# Patient Record
Sex: Male | Born: 1935 | Race: White | Hispanic: No | Marital: Married
Health system: Southern US, Community
[De-identification: ages and names within clinical notes are randomized; demographics above are authoritative.]

---

## 2005-01-11 ENCOUNTER — Other Ambulatory Visit: Payer: Self-pay

## 2005-01-11 ENCOUNTER — Inpatient Hospital Stay: Payer: Self-pay | Admitting: Specialist

## 2005-01-16 ENCOUNTER — Emergency Department: Payer: Self-pay | Admitting: Unknown Physician Specialty

## 2005-05-26 ENCOUNTER — Ambulatory Visit: Payer: Self-pay | Admitting: Unknown Physician Specialty

## 2005-09-08 ENCOUNTER — Other Ambulatory Visit: Payer: Self-pay

## 2005-09-08 ENCOUNTER — Ambulatory Visit: Payer: Self-pay | Admitting: Urology

## 2005-09-15 ENCOUNTER — Ambulatory Visit: Payer: Self-pay | Admitting: Urology

## 2006-10-17 ENCOUNTER — Ambulatory Visit: Payer: Self-pay | Admitting: Ophthalmology

## 2006-10-24 ENCOUNTER — Ambulatory Visit: Payer: Self-pay | Admitting: Ophthalmology

## 2006-11-24 ENCOUNTER — Ambulatory Visit: Payer: Self-pay | Admitting: Ophthalmology

## 2006-11-30 ENCOUNTER — Ambulatory Visit: Payer: Self-pay | Admitting: Ophthalmology

## 2007-06-16 ENCOUNTER — Ambulatory Visit: Payer: Self-pay | Admitting: General Practice

## 2007-09-07 ENCOUNTER — Ambulatory Visit: Payer: Self-pay | Admitting: General Practice

## 2007-09-20 ENCOUNTER — Ambulatory Visit: Payer: Self-pay | Admitting: General Practice

## 2007-11-14 ENCOUNTER — Inpatient Hospital Stay: Payer: Self-pay | Admitting: Internal Medicine

## 2007-11-14 ENCOUNTER — Other Ambulatory Visit: Payer: Self-pay

## 2008-04-03 ENCOUNTER — Ambulatory Visit: Payer: Self-pay | Admitting: Specialist

## 2008-05-21 ENCOUNTER — Ambulatory Visit: Payer: Self-pay | Admitting: Gastroenterology

## 2008-05-28 ENCOUNTER — Ambulatory Visit: Payer: Self-pay | Admitting: Gastroenterology

## 2008-06-06 ENCOUNTER — Ambulatory Visit: Payer: Self-pay | Admitting: Gastroenterology

## 2008-07-27 ENCOUNTER — Other Ambulatory Visit: Payer: Self-pay

## 2008-07-27 ENCOUNTER — Emergency Department: Payer: Self-pay | Admitting: Emergency Medicine

## 2008-07-28 ENCOUNTER — Emergency Department: Payer: Self-pay | Admitting: Emergency Medicine

## 2008-10-11 ENCOUNTER — Encounter: Payer: Self-pay | Admitting: Specialist

## 2009-05-05 ENCOUNTER — Ambulatory Visit: Payer: Self-pay | Admitting: Internal Medicine

## 2009-06-13 ENCOUNTER — Ambulatory Visit: Payer: Self-pay | Admitting: General Practice

## 2009-09-17 IMAGING — CT CT ABD-PELV W/ CM
1 of 2 series · 15 of 32 positions shown, 19 images · non-contrast
Comparison: none

REASON FOR EXAM: Abdominal pain, weight loss
COMMENTS:

[Series 2: abdomen · axial · 0.74mm/px · z∈[-133,+323]mm · 15 of 63 slices shown, 19 images]
[im 3/63  soft-tissue]
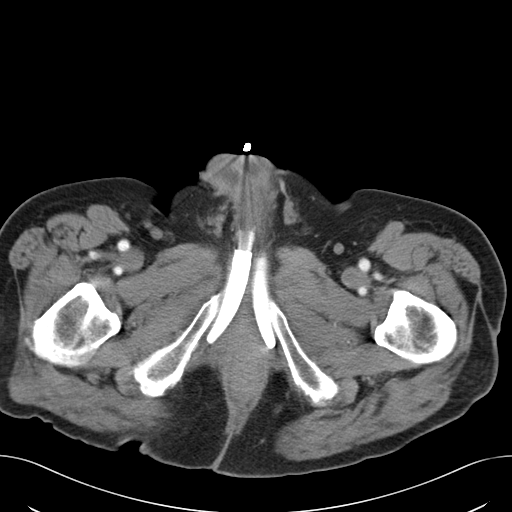
[im 3/63  bone]
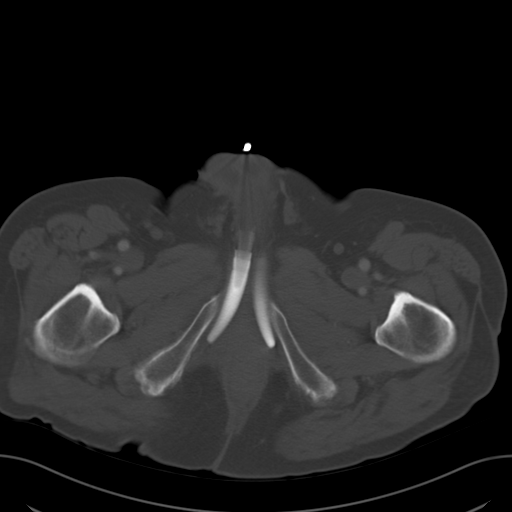
[im 8/63  soft-tissue]
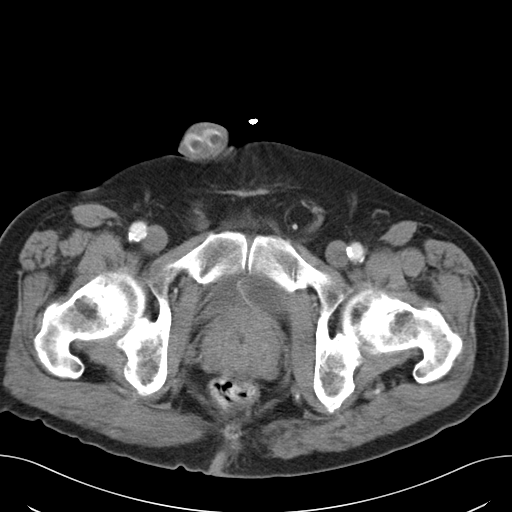
[im 13/63  soft-tissue]
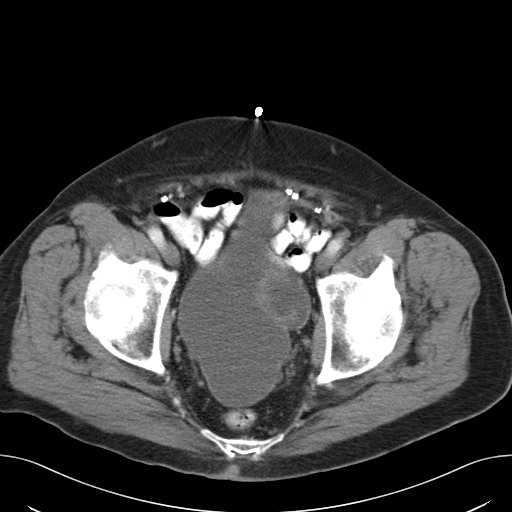
[im 19/63  soft-tissue]
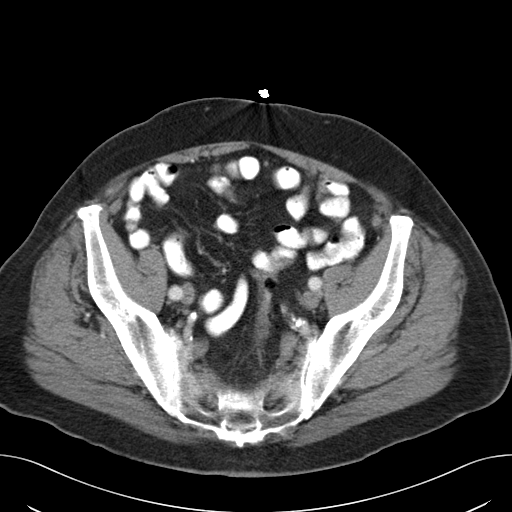
[im 21/63  soft-tissue]
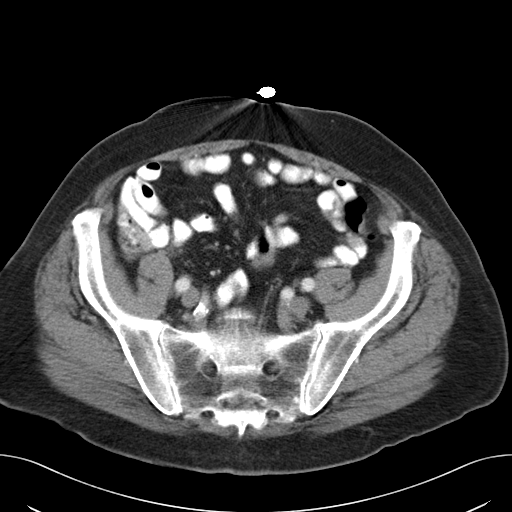
[im 26/63  soft-tissue]
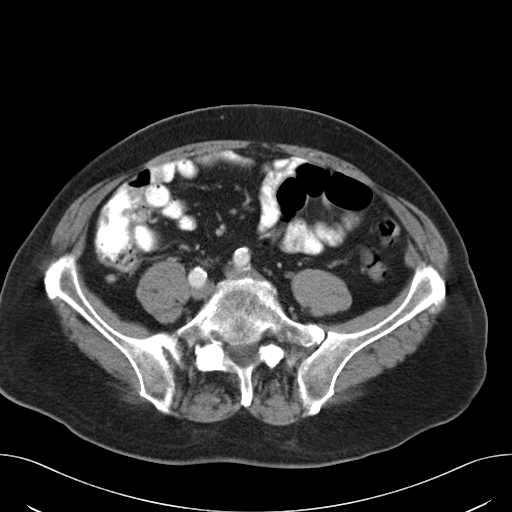
[im 32/63  soft-tissue]
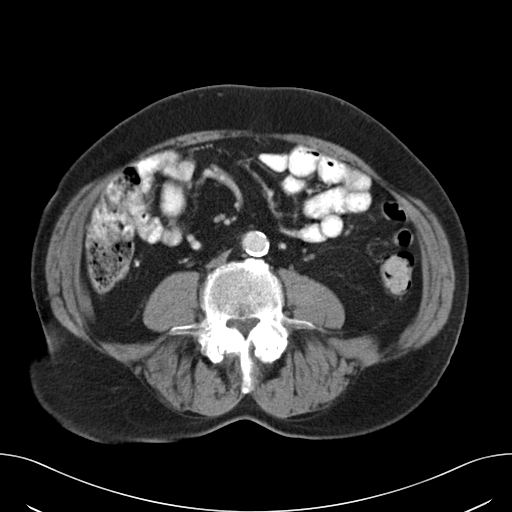
[im 37/63  soft-tissue]
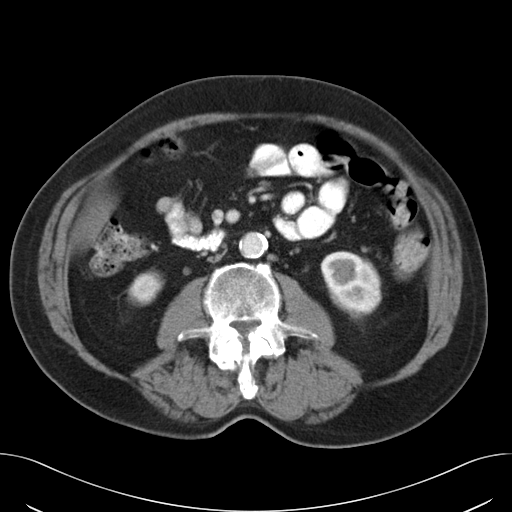
[im 42/63  soft-tissue]
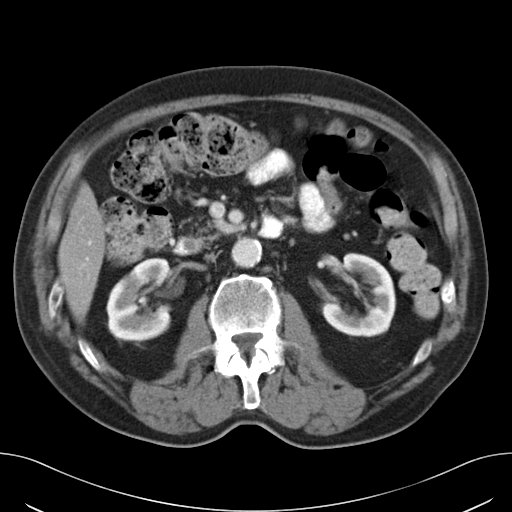
[im 42/63  bone]
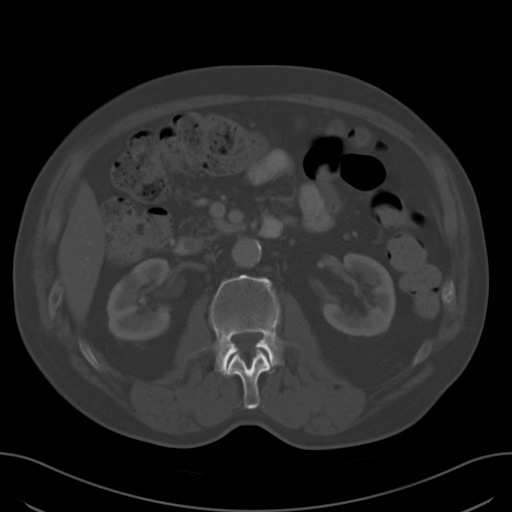
[im 44/63  soft-tissue]
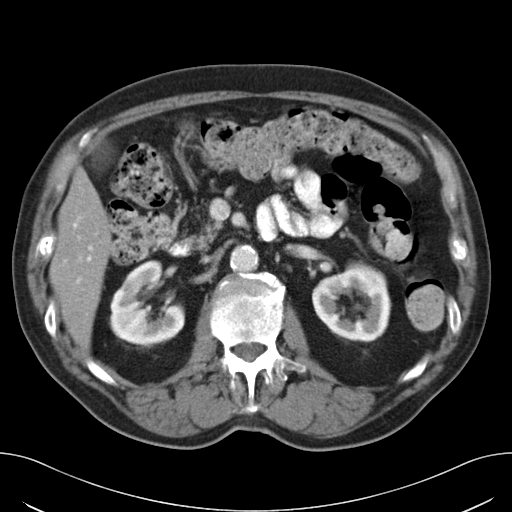
[im 50/63  soft-tissue]
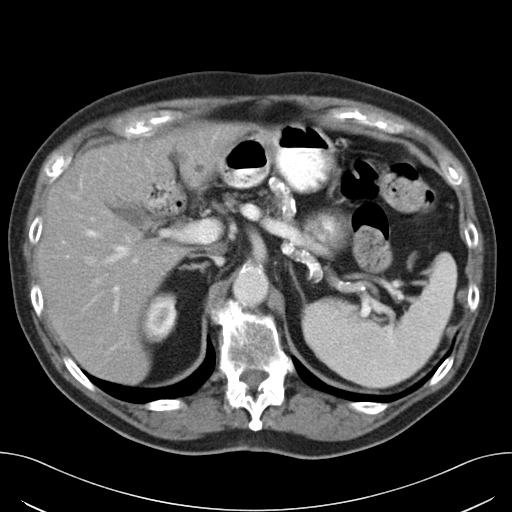
[im 52/63  lung]
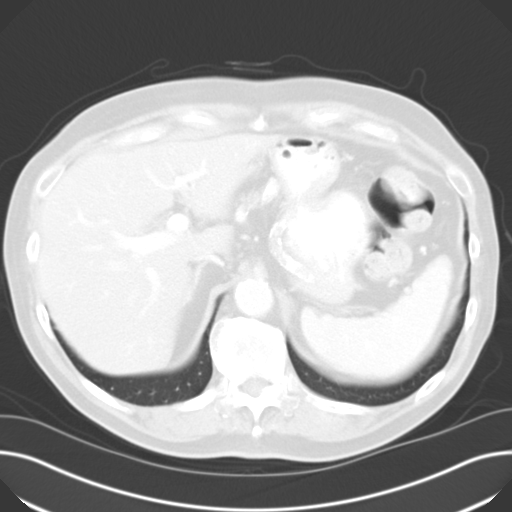
[im 55/63  soft-tissue]
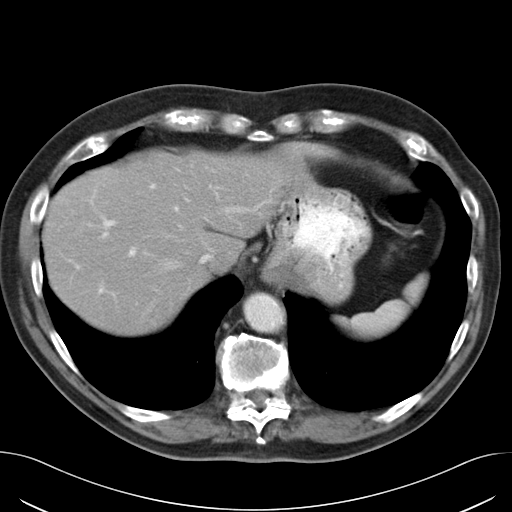
[im 55/63  lung]
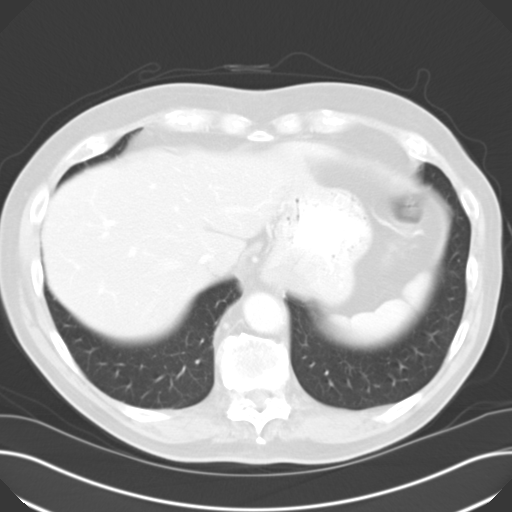
[im 57/63  lung]
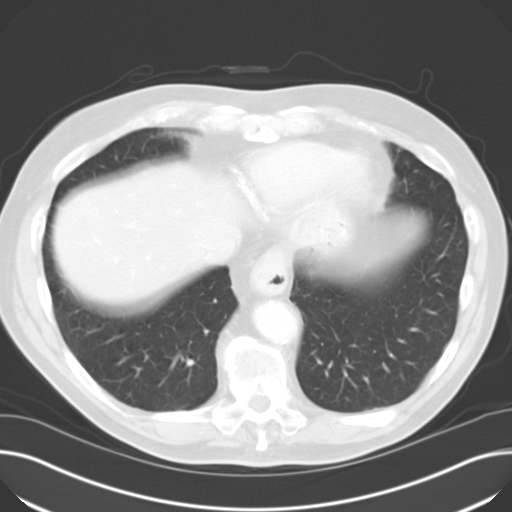
[im 60/63  soft-tissue]
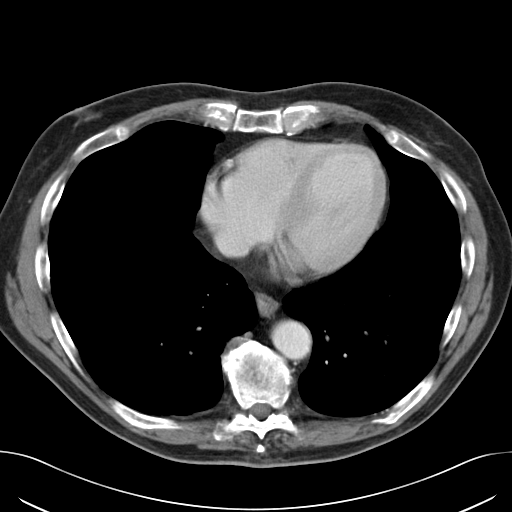
[im 60/63  lung]
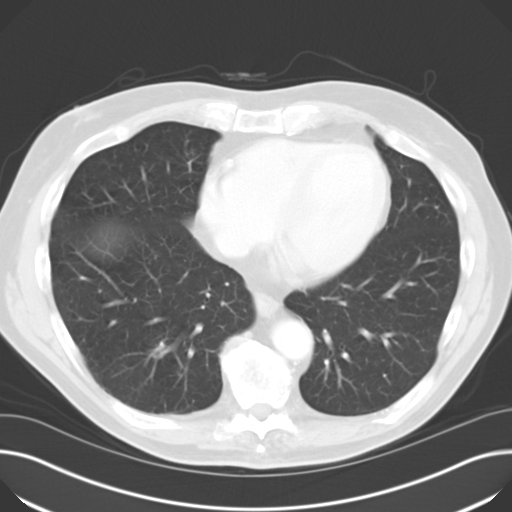

[15 of 32 positions shown; findings below may reference images not displayed]

PROCEDURE:     CT  - CT ABDOMEN / PELVIS  W  - May 21, 2008  [DATE]

RESULT:     The patient is reporting generalized abdominal discomfort and
weight loss.

The patient received 85 ml of [id number] for this study

There is radiodensity within the gallbladder which likely reflects the
presence of multiple stones. I do not see classic gallbladder wall
calcification. Further evaluation with Gallbladder Ultrasound would be
useful. The liver exhibits subcentimeter hypo densities especially in the
LEFT lobe compatible with cysts. There is no intrahepatic ductal dilation.
The spleen is normal in density and contour. Splenic arterial calcification
is present. The partially distended stomach is grossly normal. There is a
small, hiatal hernia. The pancreas is largely fatty replaced. I do not see
evidence of a pancreatic mass. The adrenal glands are not enlarged. The
kidneys exhibit no evidence of obstruction. There are cortical hypo
densities in the mid and lower pole of the RIGHT kidney compatible with
cysts. The caliber of the abdominal aorta is normal. There is no evidence of
ascites.

The prostate gland is seen to be enlarged. The urinary bladder is adequately
distended. There is a reservoir for inflation of a penile prosthesis
present. The patient has undergone prior inguinal hernia repair. I see no
acute bowel abnormality. The appendix is demonstrated and appears normal.

The lung bases are clear. The lumbar vertebral bodies are preserved in
height.
IMPRESSION: 1.  There are gallstones present. The appearance is not entirely typical of
numerous, well defined gallstones and the possibility of a gallbladder mass
is raised. This will merit further evaluation with ultrasound.
2.  I do not see objective evidence of acute intra-abdominal abnormality
otherwise.

## 2009-09-24 IMAGING — US ABDOMEN ULTRASOUND
1 series · 17 of 25 positions shown · non-contrast
Comparison: none

REASON FOR EXAM: abd pain    weight loss
COMMENTS:

[Series 1: abdomen ultrasound · 17 of 65 slices shown]
[im 1/65]
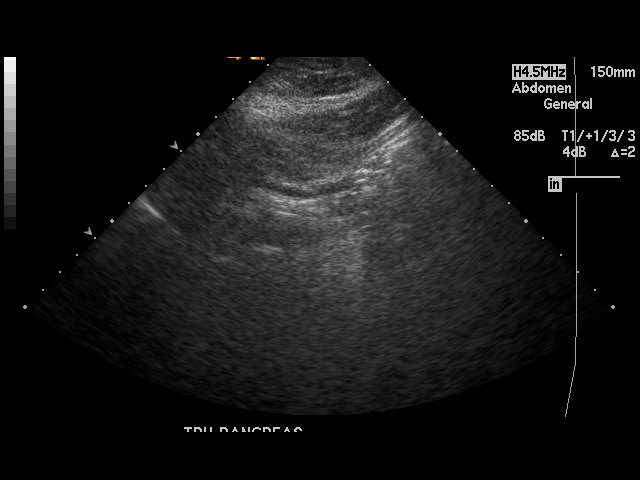
[im 6/65]
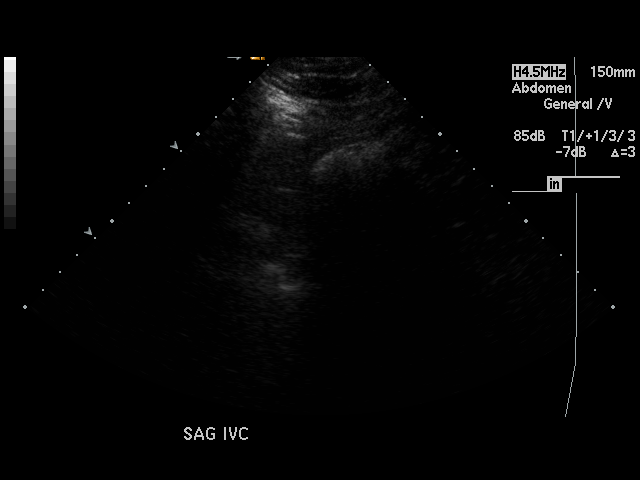
[im 9/65]
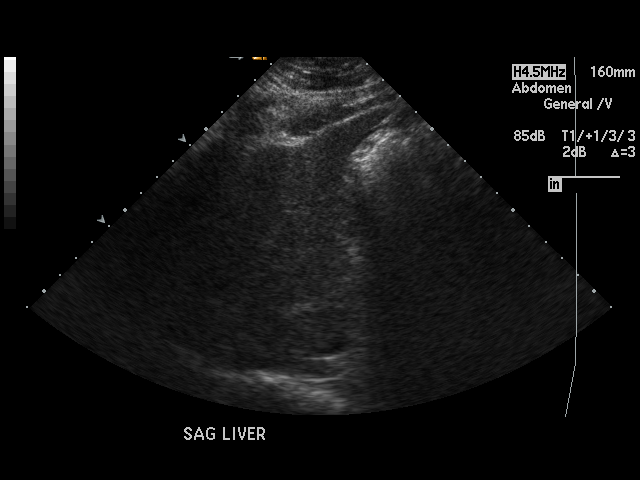
[im 14/65]
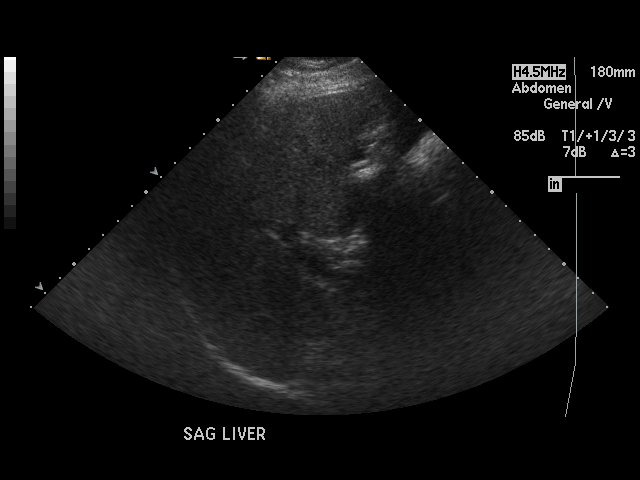
[im 17/65]
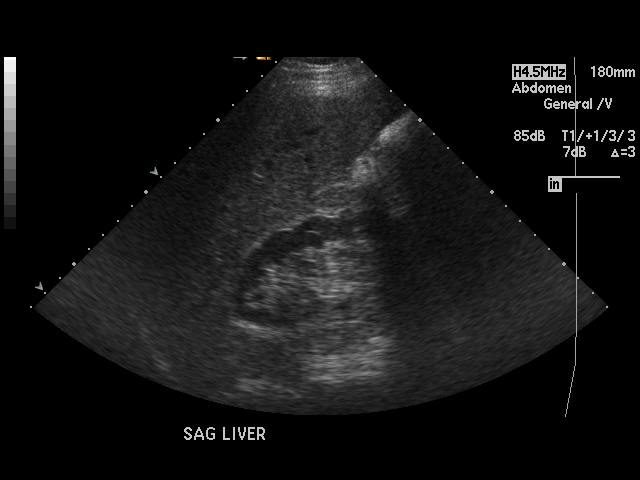
[im 22/65]
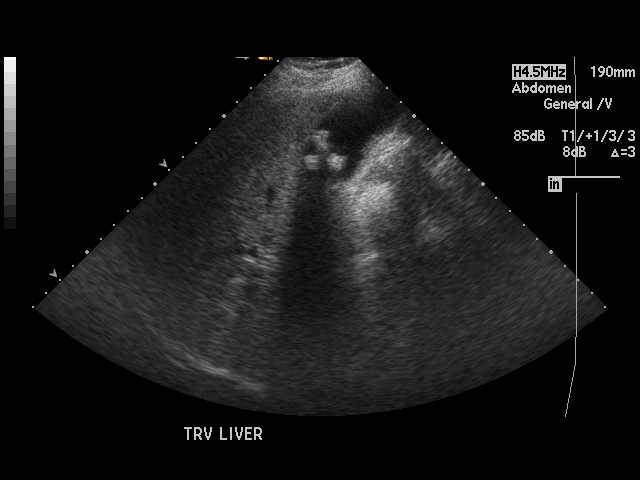
[im 25/65]
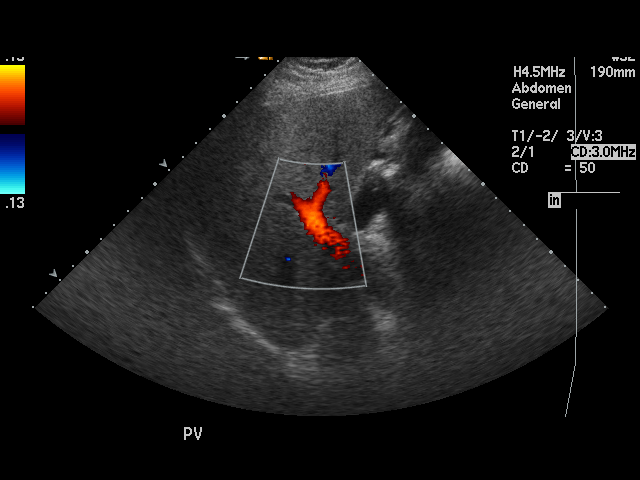
[im 30/65]
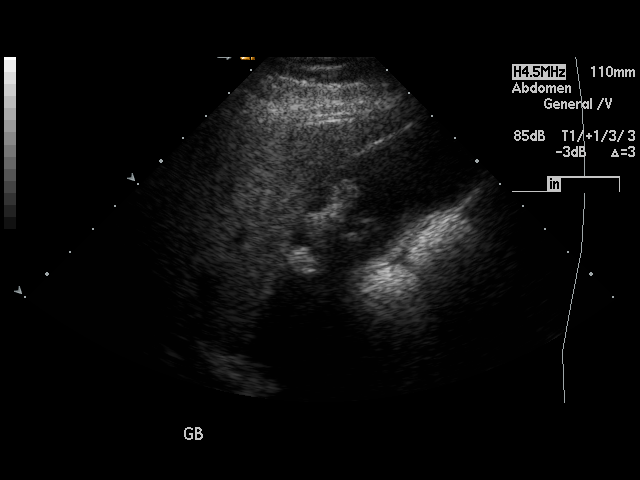
[im 33/65]
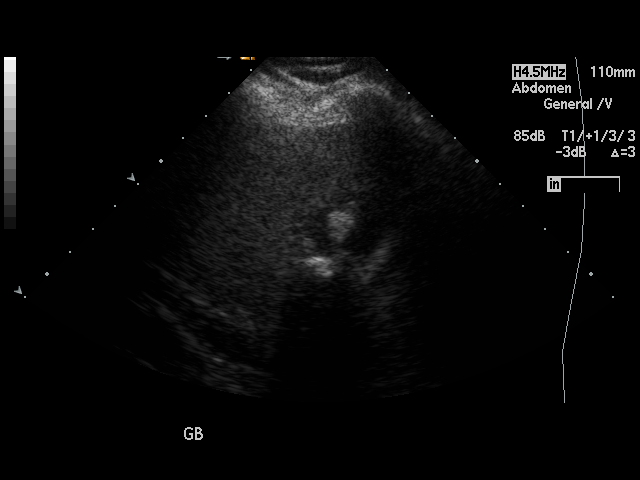
[im 35/65]
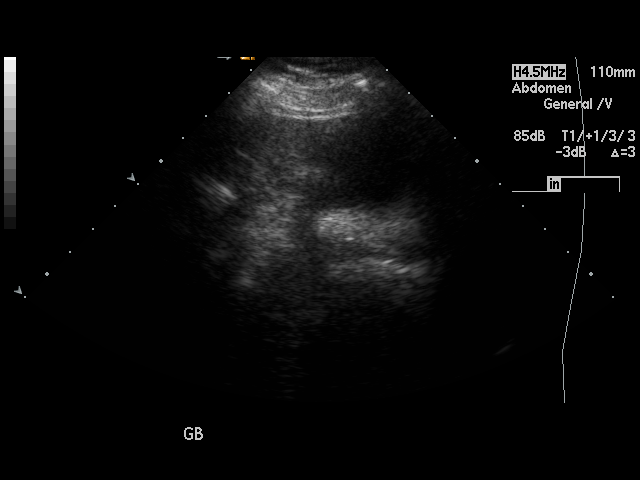
[im 41/65]
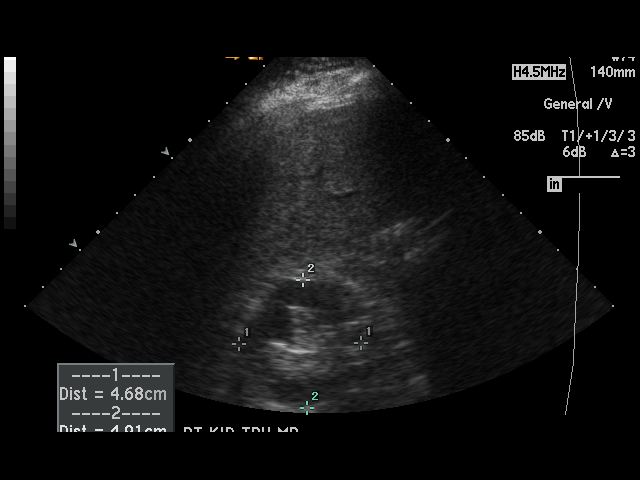
[im 43/65]
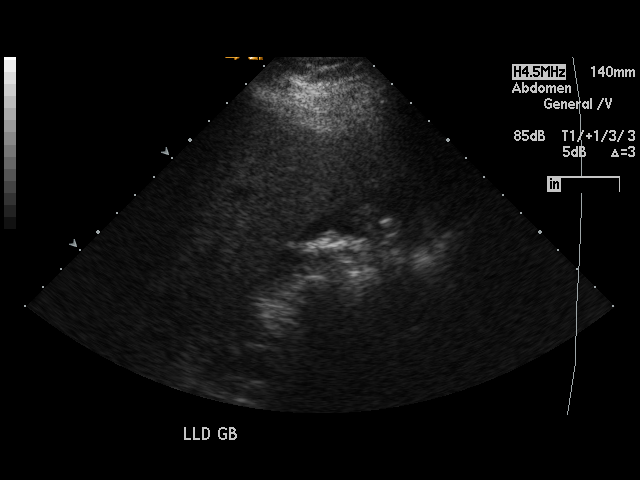
[im 49/65]
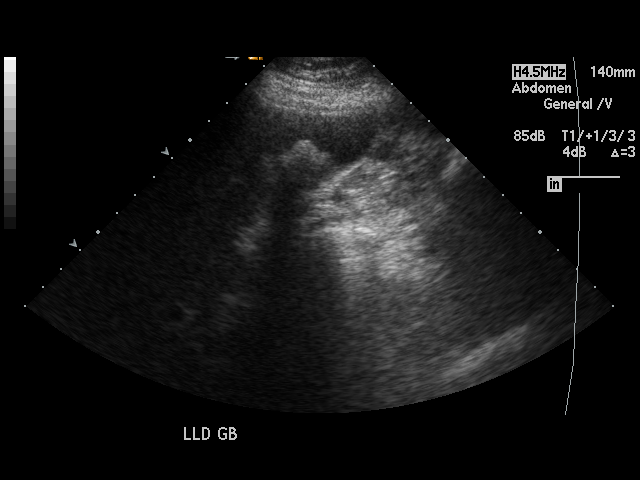
[im 51/65]
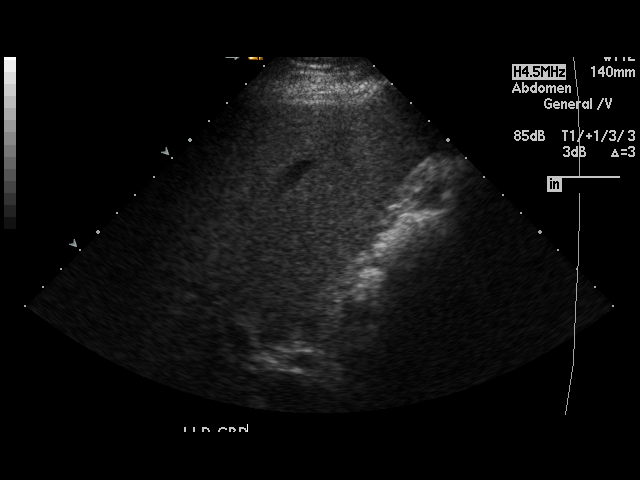
[im 57/65]
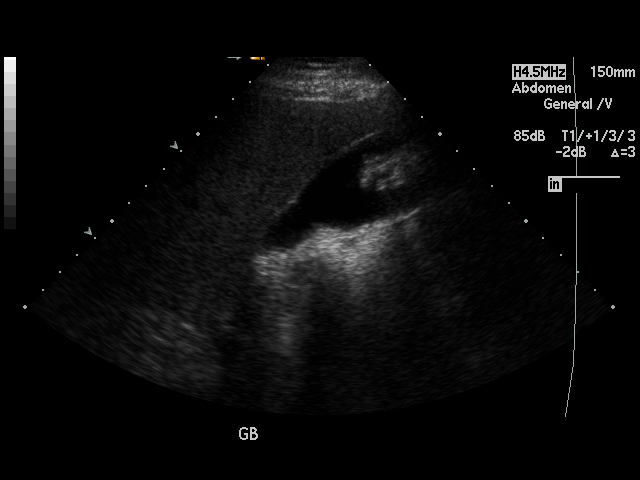
[im 59/65]
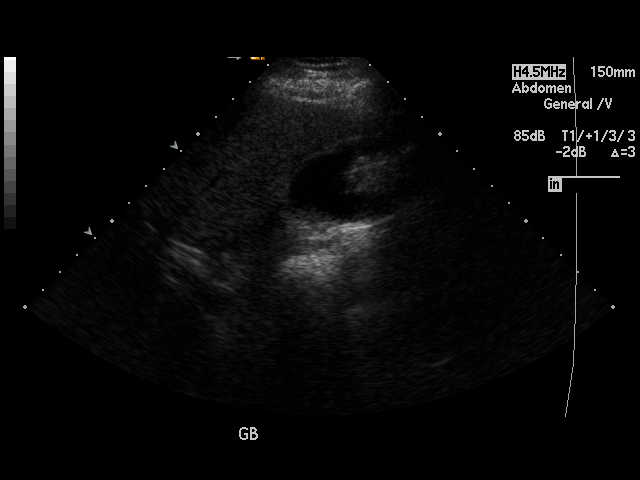
[im 65/65]
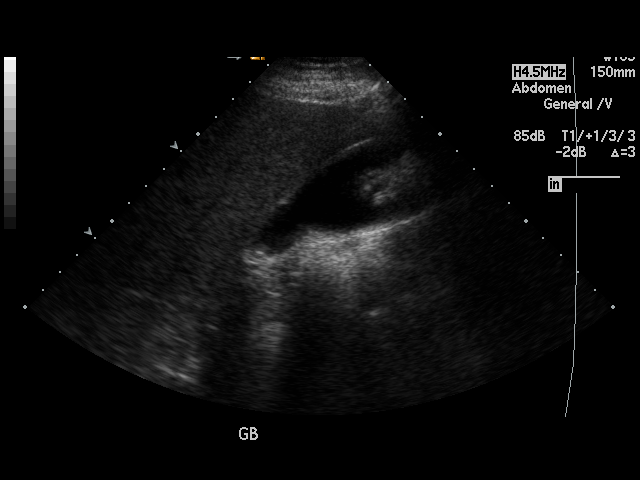

[17 of 25 positions shown; findings below may reference images not displayed]

PROCEDURE:     US  - US ABDOMEN GENERAL SURVEY  - May 28, 2008  [DATE]

RESULT:     The liver exhibits normal echotexture with no evidence of a mass
or ductal dilation. Portal venous flow is normal in direction toward the
liver. The gallbladder is adequately distended with multiple mobile
echogenic gallstones present. The gallbladder wall is not thickened and
there is no pericholecystic fluid. There is no sonographic Murphy's sign.
The common bile duct is normal at 4.1 mm in diameter.

The pancreas could not be demonstrated due to the presence of bowel gas. The
spleen and kidneys exhibit no acute abnormality. The abdominal aorta was
obscured by bowel gas.
IMPRESSION: 1. There are multiple mobile gallstones. I do not see evidence of
gallbladder wall thickening or pericholecystic fluid.
2. There is nonvisualization of the abdominal aorta and pancreas.
3. I see no acute abnormality elsewhere.

## 2011-10-11 ENCOUNTER — Ambulatory Visit: Payer: Self-pay | Admitting: Internal Medicine

## 2012-02-24 ENCOUNTER — Ambulatory Visit: Payer: Self-pay | Admitting: General Practice

## 2012-06-19 ENCOUNTER — Ambulatory Visit: Payer: Self-pay | Admitting: General Practice

## 2012-06-19 LAB — CBC
HCT: 36.4 % — ABNORMAL LOW (ref 40.0–52.0)
HGB: 12.9 g/dL — ABNORMAL LOW (ref 13.0–18.0)
MCH: 31.9 pg (ref 26.0–34.0)
MCHC: 35.5 g/dL (ref 32.0–36.0)
MCV: 90 fL (ref 80–100)
RBC: 4.05 10*6/uL — ABNORMAL LOW (ref 4.40–5.90)

## 2012-06-19 LAB — BASIC METABOLIC PANEL
Anion Gap: 4 — ABNORMAL LOW (ref 7–16)
Calcium, Total: 8.7 mg/dL (ref 8.5–10.1)
Chloride: 102 mmol/L (ref 98–107)
Co2: 32 mmol/L (ref 21–32)
EGFR (African American): >60

## 2012-06-19 LAB — APTT: Activated PTT: 33.7 secs (ref 23.6–35.9)

## 2012-06-19 LAB — URINALYSIS, COMPLETE
Bacteria: NONE SEEN
Bilirubin,UR: NEGATIVE
Glucose,UR: NEGATIVE mg/dL (ref 0–75)
Ketone: NEGATIVE
RBC,UR: 1 /HPF (ref 0–5)

## 2012-06-19 LAB — MRSA PCR SCREENING

## 2012-06-19 LAB — SEDIMENTATION RATE: Erythrocyte Sed Rate: 2 mm/hr (ref 0–20)

## 2012-06-19 LAB — PROTIME-INR: INR: 1

## 2012-06-20 LAB — URINE CULTURE

## 2012-07-05 ENCOUNTER — Inpatient Hospital Stay: Payer: Self-pay | Admitting: General Practice

## 2012-07-06 LAB — BASIC METABOLIC PANEL
Calcium, Total: 8.1 mg/dL — ABNORMAL LOW (ref 8.5–10.1)
Chloride: 104 mmol/L (ref 98–107)
Glucose: 98 mg/dL (ref 65–99)
Osmolality: 281 (ref 275–301)
Potassium: 3.7 mmol/L (ref 3.5–5.1)

## 2012-07-06 LAB — HEMOGLOBIN: HGB: 11.1 g/dL — ABNORMAL LOW (ref 13.0–18.0)

## 2012-07-06 LAB — PLATELET COUNT: Platelet: 170 10*3/uL (ref 150–440)

## 2012-07-07 LAB — BASIC METABOLIC PANEL
BUN: 13 mg/dL (ref 7–18)
Chloride: 102 mmol/L (ref 98–107)
EGFR (African American): >60
Potassium: 3.7 mmol/L (ref 3.5–5.1)
Sodium: 136 mmol/L (ref 136–145)

## 2012-07-07 LAB — HEMOGLOBIN: HGB: 10.3 g/dL — ABNORMAL LOW (ref 13.0–18.0)

## 2012-07-08 LAB — CBC WITH DIFFERENTIAL/PLATELET
Basophil #: 0 10*3/uL (ref 0.0–0.1)
Basophil %: 0.2 %
Eosinophil #: 0.3 10*3/uL (ref 0.0–0.7)
HCT: 26.5 % — ABNORMAL LOW (ref 40.0–52.0)
Lymphocyte #: 0.8 10*3/uL — ABNORMAL LOW (ref 1.0–3.6)
Lymphocyte %: 9 %
MCHC: 34.2 g/dL (ref 32.0–36.0)
MCV: 89 fL (ref 80–100)
Monocyte %: 8.1 %
Neutrophil #: 7.3 10*3/uL — ABNORMAL HIGH (ref 1.4–6.5)
Platelet: 231 10*3/uL (ref 150–440)
RDW: 13.1 % (ref 11.5–14.5)

## 2012-10-10 ENCOUNTER — Ambulatory Visit: Payer: Self-pay | Admitting: Internal Medicine

## 2012-10-21 ENCOUNTER — Emergency Department: Payer: Self-pay | Admitting: Emergency Medicine

## 2012-10-21 LAB — CBC
HCT: 37.7 % — ABNORMAL LOW (ref 40.0–52.0)
HGB: 12.6 g/dL — ABNORMAL LOW (ref 13.0–18.0)
MCV: 86 fL (ref 80–100)
RBC: 4.39 10*6/uL — ABNORMAL LOW (ref 4.40–5.90)
RDW: 13.5 % (ref 11.5–14.5)
WBC: 11.1 10*3/uL — ABNORMAL HIGH (ref 3.8–10.6)

## 2012-10-21 LAB — URINALYSIS, COMPLETE
Bacteria: NONE SEEN
Bilirubin,UR: NEGATIVE
Blood: NEGATIVE
Glucose,UR: NEGATIVE mg/dL (ref 0–75)
Hyaline Cast: 3
Ketone: NEGATIVE
Leukocyte Esterase: NEGATIVE
Ph: 8 (ref 4.5–8.0)
Protein: NEGATIVE
RBC,UR: NONE SEEN /HPF (ref 0–5)
Squamous Epithelial: NONE SEEN
WBC UR: 1 /HPF (ref 0–5)

## 2012-10-21 LAB — COMPREHENSIVE METABOLIC PANEL
Albumin: 4 g/dL (ref 3.4–5.0)
Alkaline Phosphatase: 94 U/L (ref 50–136)
BUN: 15 mg/dL (ref 7–18)
Bilirubin,Total: 0.8 mg/dL (ref 0.2–1.0)
Calcium, Total: 9.3 mg/dL (ref 8.5–10.1)
Chloride: 96 mmol/L — ABNORMAL LOW (ref 98–107)
Co2: 29 mmol/L (ref 21–32)
EGFR (African American): >60
EGFR (Non-African Amer.): >60
Glucose: 105 mg/dL — ABNORMAL HIGH (ref 65–99)
Osmolality: 268 (ref 275–301)
SGOT(AST): 20 U/L (ref 15–37)
Sodium: 133 mmol/L — ABNORMAL LOW (ref 136–145)

## 2012-10-21 LAB — LIPASE, BLOOD: Lipase: 66 U/L — ABNORMAL LOW (ref 73–393)

## 2012-10-21 LAB — TROPONIN I: Troponin-I: <0.02 ng/mL

## 2012-11-16 ENCOUNTER — Inpatient Hospital Stay: Payer: Self-pay | Admitting: Internal Medicine

## 2012-11-16 LAB — CBC WITH DIFFERENTIAL/PLATELET
Basophil #: 0.1 10*3/uL (ref 0.0–0.1)
Eosinophil #: 0 10*3/uL (ref 0.0–0.7)
Eosinophil %: 0.2 %
HCT: 33.8 % — ABNORMAL LOW (ref 40.0–52.0)
HGB: 11.9 g/dL — ABNORMAL LOW (ref 13.0–18.0)
Lymphocyte #: 1.2 10*3/uL (ref 1.0–3.6)
MCH: 30.3 pg (ref 26.0–34.0)
MCV: 86 fL (ref 80–100)
Monocyte #: 0.8 x10 3/mm (ref 0.2–1.0)
Monocyte %: 7.7 %
Neutrophil #: 8.4 10*3/uL — ABNORMAL HIGH (ref 1.4–6.5)
Neutrophil %: 79.6 %
Platelet: 368 10*3/uL (ref 150–440)
WBC: 10.5 10*3/uL (ref 3.8–10.6)

## 2012-11-16 LAB — COMPREHENSIVE METABOLIC PANEL
Albumin: 3.7 g/dL (ref 3.4–5.0)
Alkaline Phosphatase: 88 U/L (ref 50–136)
Anion Gap: 9 (ref 7–16)
BUN: 19 mg/dL — ABNORMAL HIGH (ref 7–18)
Bilirubin,Total: 0.6 mg/dL (ref 0.2–1.0)
Calcium, Total: 9.3 mg/dL (ref 8.5–10.1)
Creatinine: 0.98 mg/dL (ref 0.60–1.30)
Glucose: 89 mg/dL (ref 65–99)
Sodium: 134 mmol/L — ABNORMAL LOW (ref 136–145)

## 2012-11-16 LAB — LIPASE, BLOOD: Lipase: 66 U/L — ABNORMAL LOW (ref 73–393)

## 2012-11-22 LAB — CULTURE, BLOOD (SINGLE)

## 2013-02-11 ENCOUNTER — Inpatient Hospital Stay: Payer: Self-pay | Admitting: Internal Medicine

## 2013-02-11 LAB — COMPREHENSIVE METABOLIC PANEL
Alkaline Phosphatase: 92 U/L (ref 50–136)
Anion Gap: 9 (ref 7–16)
Bilirubin,Total: 0.9 mg/dL (ref 0.2–1.0)
Chloride: 104 mmol/L (ref 98–107)
Creatinine: 0.75 mg/dL (ref 0.60–1.30)
EGFR (African American): >60
EGFR (Non-African Amer.): >60
Potassium: 4.3 mmol/L (ref 3.5–5.1)
SGOT(AST): 30 U/L (ref 15–37)
Total Protein: 7.7 g/dL (ref 6.4–8.2)

## 2013-02-11 LAB — URINALYSIS, COMPLETE
Bilirubin,UR: NEGATIVE
Glucose,UR: NEGATIVE mg/dL (ref 0–75)
Nitrite: NEGATIVE
Protein: 100

## 2013-02-11 LAB — CBC
HCT: 35.9 % — ABNORMAL LOW (ref 40.0–52.0)
MCV: 86 fL (ref 80–100)
WBC: 21.4 10*3/uL — ABNORMAL HIGH (ref 3.8–10.6)

## 2013-02-12 LAB — CBC WITH DIFFERENTIAL/PLATELET
Basophil %: 0.1 %
HCT: 31.8 % — ABNORMAL LOW (ref 40.0–52.0)
HGB: 10.9 g/dL — ABNORMAL LOW (ref 13.0–18.0)
Lymphocyte #: 0.7 10*3/uL — ABNORMAL LOW (ref 1.0–3.6)
Lymphocyte %: 4.2 %
MCH: 29.6 pg (ref 26.0–34.0)
MCHC: 34.4 g/dL (ref 32.0–36.0)
MCV: 86 fL (ref 80–100)
Monocyte #: 1.2 x10 3/mm — ABNORMAL HIGH (ref 0.2–1.0)
Monocyte %: 7.2 %
Neutrophil #: 14.5 10*3/uL — ABNORMAL HIGH (ref 1.4–6.5)
Platelet: 283 10*3/uL (ref 150–440)
RDW: 12.9 % (ref 11.5–14.5)

## 2013-02-12 LAB — BASIC METABOLIC PANEL
BUN: 24 mg/dL — ABNORMAL HIGH (ref 7–18)
Calcium, Total: 8.2 mg/dL — ABNORMAL LOW (ref 8.5–10.1)
Glucose: 104 mg/dL — ABNORMAL HIGH (ref 65–99)
Osmolality: 276 (ref 275–301)
Potassium: 3.6 mmol/L (ref 3.5–5.1)
Sodium: 136 mmol/L (ref 136–145)

## 2013-02-13 LAB — BASIC METABOLIC PANEL
Anion Gap: 8 (ref 7–16)
Calcium, Total: 8.2 mg/dL — ABNORMAL LOW (ref 8.5–10.1)
Chloride: 107 mmol/L (ref 98–107)
Co2: 24 mmol/L (ref 21–32)
Creatinine: 0.72 mg/dL (ref 0.60–1.30)
EGFR (African American): >60
EGFR (Non-African Amer.): >60
Glucose: 108 mg/dL — ABNORMAL HIGH (ref 65–99)
Osmolality: 282 (ref 275–301)
Potassium: 3.6 mmol/L (ref 3.5–5.1)

## 2013-02-13 LAB — CBC WITH DIFFERENTIAL/PLATELET
Basophil %: 0.1 %
Eosinophil %: 0.3 %
HCT: 29.6 % — ABNORMAL LOW (ref 40.0–52.0)
HGB: 10.3 g/dL — ABNORMAL LOW (ref 13.0–18.0)
MCHC: 34.7 g/dL (ref 32.0–36.0)
MCV: 87 fL (ref 80–100)
Monocyte #: 1.1 x10 3/mm — ABNORMAL HIGH (ref 0.2–1.0)
Neutrophil %: 84.9 %
RDW: 12.6 % (ref 11.5–14.5)

## 2013-02-13 LAB — URINE CULTURE

## 2013-02-15 LAB — COMPREHENSIVE METABOLIC PANEL
Albumin: 2 g/dL — ABNORMAL LOW (ref 3.4–5.0)
Anion Gap: 9 (ref 7–16)
Bilirubin,Total: 0.5 mg/dL (ref 0.2–1.0)
Calcium, Total: 8.1 mg/dL — ABNORMAL LOW (ref 8.5–10.1)
Chloride: 107 mmol/L (ref 98–107)
Creatinine: 0.66 mg/dL (ref 0.60–1.30)
EGFR (African American): >60
EGFR (Non-African Amer.): >60
Potassium: 3.2 mmol/L — ABNORMAL LOW (ref 3.5–5.1)
SGOT(AST): 16 U/L (ref 15–37)
SGPT (ALT): 11 U/L — ABNORMAL LOW (ref 12–78)
Total Protein: 6.4 g/dL (ref 6.4–8.2)

## 2013-02-15 LAB — CBC WITH DIFFERENTIAL/PLATELET
Basophil #: 0 10*3/uL (ref 0.0–0.1)
Eosinophil #: 0.1 10*3/uL (ref 0.0–0.7)
Eosinophil %: 0.4 %
HCT: 32.3 % — ABNORMAL LOW (ref 40.0–52.0)
HGB: 10.7 g/dL — ABNORMAL LOW (ref 13.0–18.0)
Lymphocyte #: 0.8 10*3/uL — ABNORMAL LOW (ref 1.0–3.6)
Lymphocyte %: 6.1 %
MCHC: 33.2 g/dL (ref 32.0–36.0)
Monocyte #: 1.1 x10 3/mm — ABNORMAL HIGH (ref 0.2–1.0)
Neutrophil #: 10.5 10*3/uL — ABNORMAL HIGH (ref 1.4–6.5)
Neutrophil %: 84.3 %
Platelet: 403 10*3/uL (ref 150–440)
RBC: 3.69 10*6/uL — ABNORMAL LOW (ref 4.40–5.90)

## 2013-02-16 ENCOUNTER — Encounter: Payer: Self-pay | Admitting: Internal Medicine

## 2013-02-16 LAB — CBC WITH DIFFERENTIAL/PLATELET
HCT: 31 % — ABNORMAL LOW (ref 40.0–52.0)
HGB: 11 g/dL — ABNORMAL LOW (ref 13.0–18.0)
Lymphocyte #: 0.9 10*3/uL — ABNORMAL LOW (ref 1.0–3.6)
MCH: 30.6 pg (ref 26.0–34.0)
MCHC: 35.5 g/dL (ref 32.0–36.0)
MCV: 86 fL (ref 80–100)
Monocyte #: 0.9 x10 3/mm (ref 0.2–1.0)
Monocyte %: 8.6 %
Neutrophil #: 8.9 10*3/uL — ABNORMAL HIGH (ref 1.4–6.5)
Neutrophil %: 82 %
RBC: 3.6 10*6/uL — ABNORMAL LOW (ref 4.40–5.90)
RDW: 12.3 % (ref 11.5–14.5)
WBC: 10.9 10*3/uL — ABNORMAL HIGH (ref 3.8–10.6)

## 2013-02-16 LAB — BASIC METABOLIC PANEL
Anion Gap: 7 (ref 7–16)
BUN: 12 mg/dL (ref 7–18)
Chloride: 106 mmol/L (ref 98–107)
Co2: 26 mmol/L (ref 21–32)
Creatinine: 0.64 mg/dL (ref 0.60–1.30)
EGFR (African American): >60
EGFR (Non-African Amer.): >60

## 2013-02-16 LAB — VANCOMYCIN, TROUGH: Vancomycin, Trough: 3 ug/mL — ABNORMAL LOW (ref 10–20)

## 2014-02-06 IMAGING — US ABDOMEN ULTRASOUND LIMITED
1 series · 14 of 25 positions shown · non-contrast
Comparison: none

REASON FOR EXAM: gallbladder aorta   abd pain
COMMENTS:

[Series 1: abdomen ultrasound limited · 0.25mm/px · 14 of 81 slices shown]
[im 1/81]
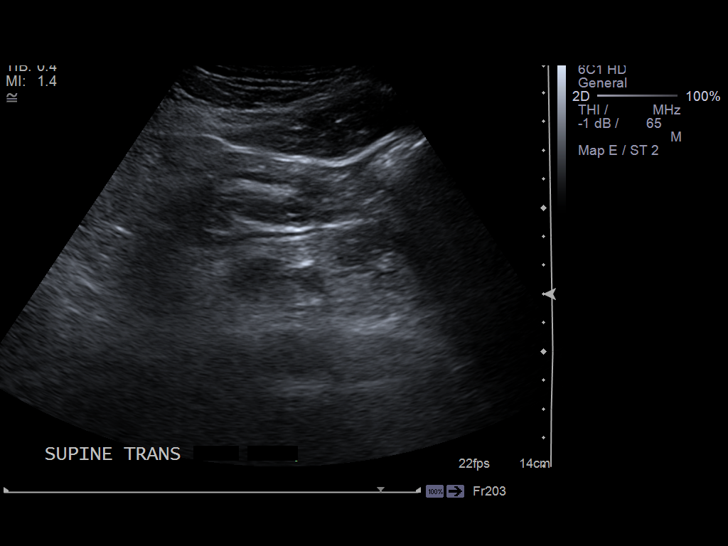
[im 7/81]
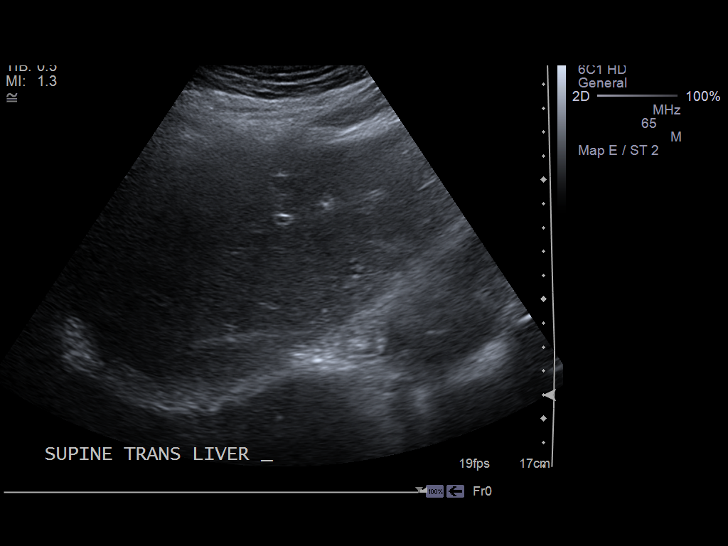
[im 14/81]
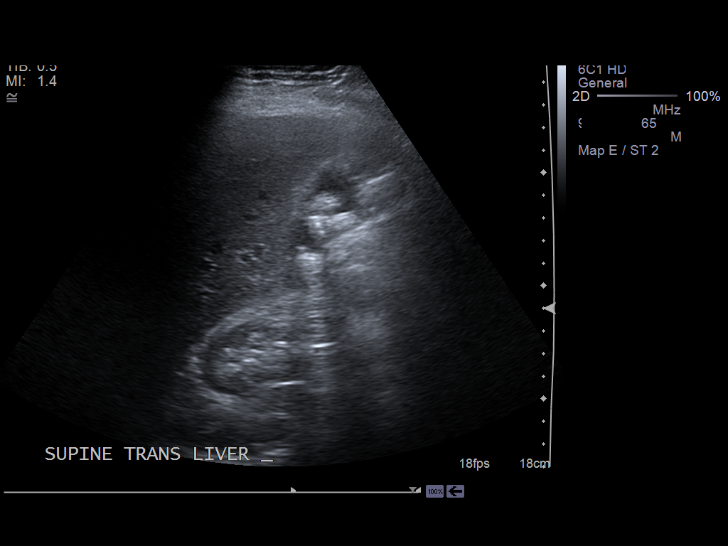
[im 21/81]
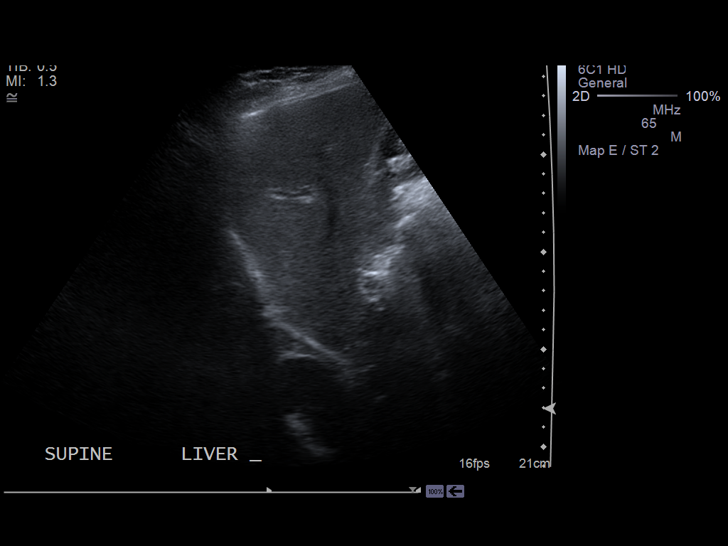
[im 27/81]
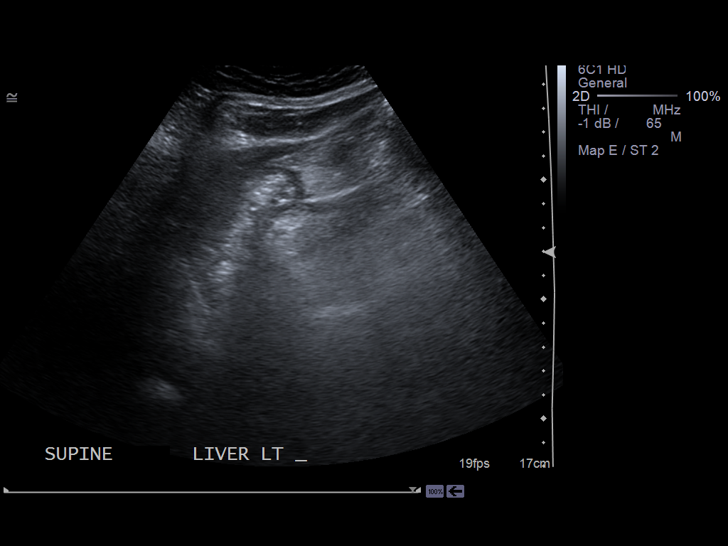
[im 31/81]
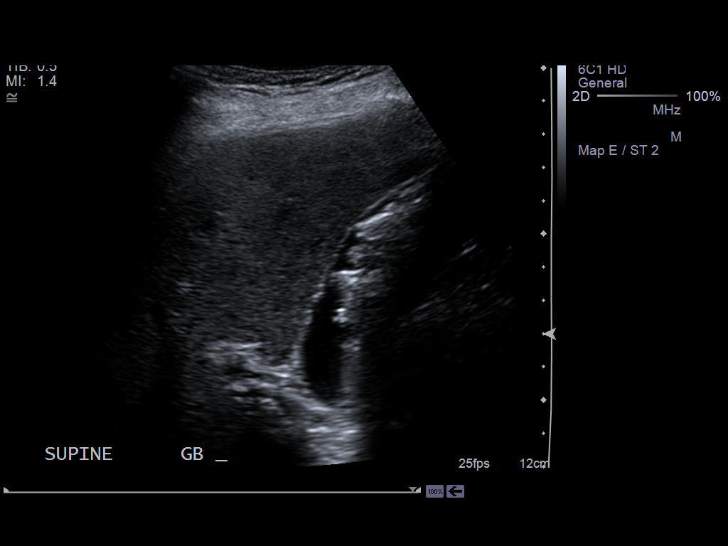
[im 37/81]
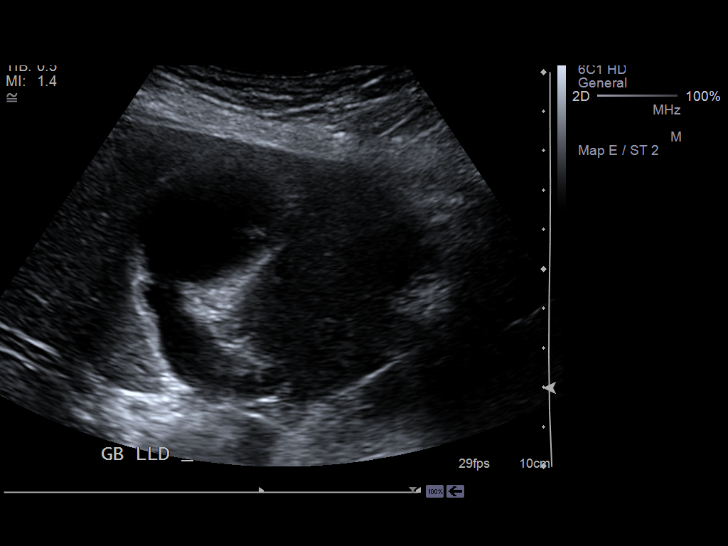
[im 44/81]
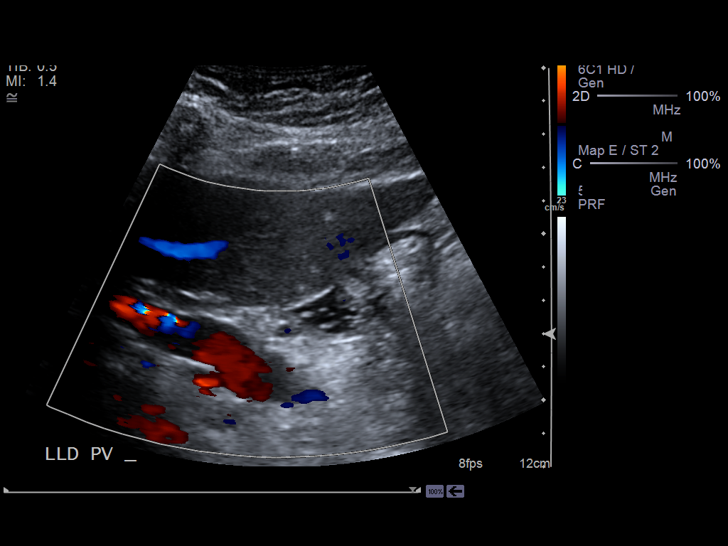
[im 51/81]
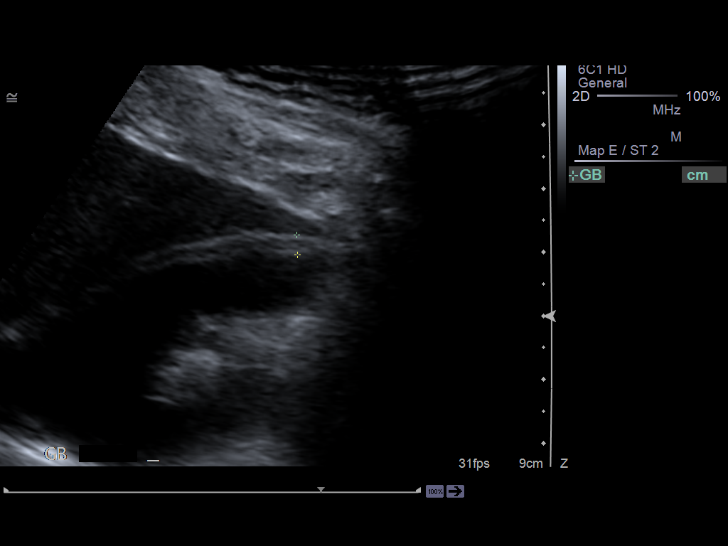
[im 54/81]
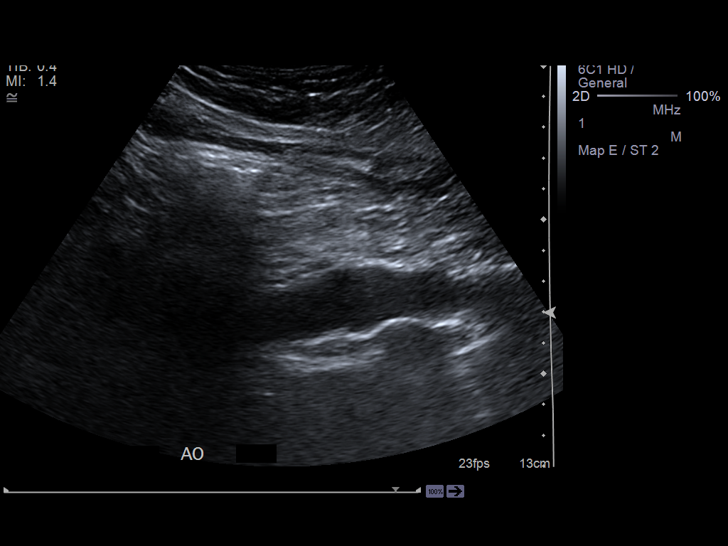
[im 61/81]
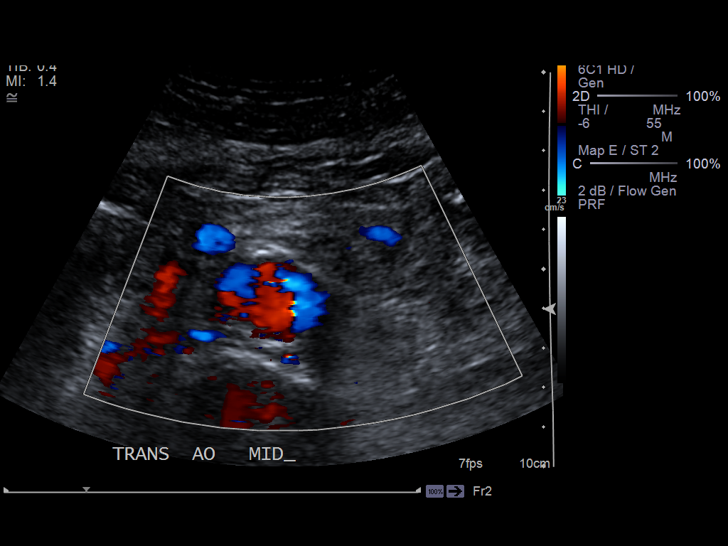
[im 67/81]
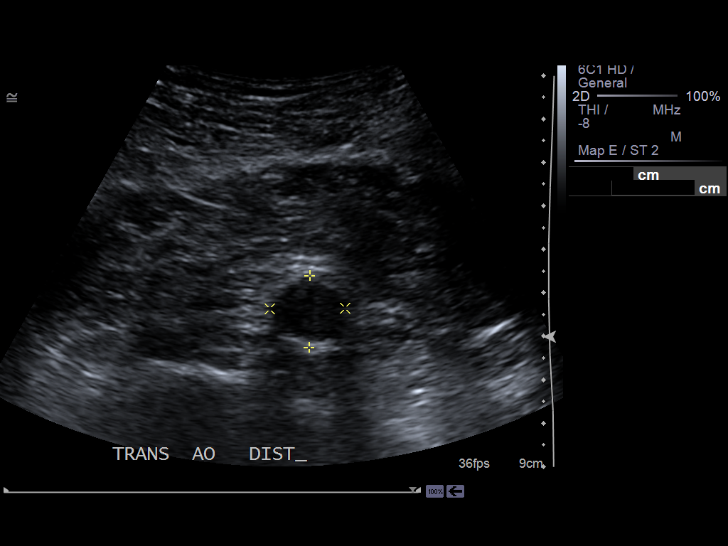
[im 74/81]
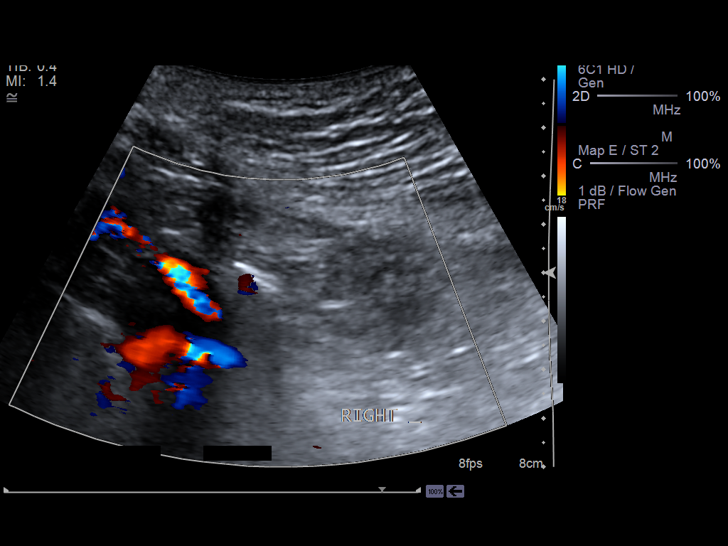
[im 81/81]
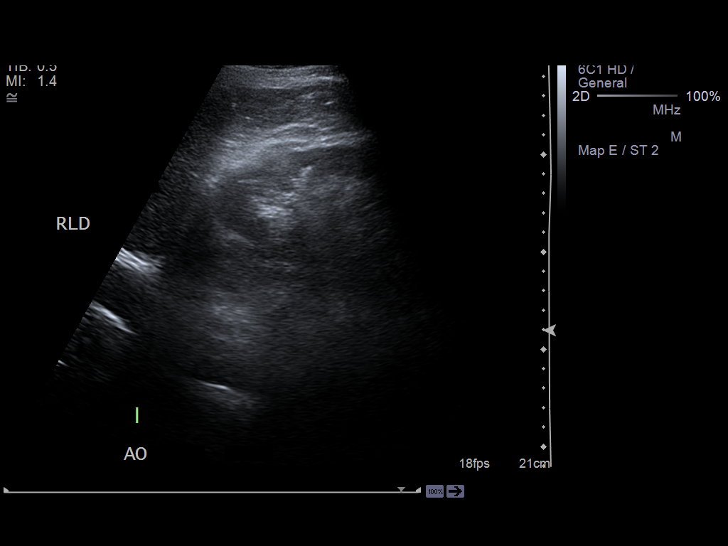

[14 of 25 positions shown; findings below may reference images not displayed]

PROCEDURE:     US  - US ABDOMEN LIMITED SURVEY  - October 10, 2012  [DATE]

RESULT:     A limited abdominal ultrasound was performed.

The gallbladder is adequately distended and contains multiple mobile stones.
The gallbladder wall is mildly thickened at 3.3 mm. There is no
pericholecystic fluid or positive sonographic Murphy's sign. The common bile
duct is normal at 3.3 mm in diameter. The liver and the observed portions of
the pancreatic body appear normal. The abdominal aorta measures maximum
cm proximally. In its midportion it also measures 2.7 cm in maximal
dimension. Distally it measures 1.7 cm in diameter. The right common right
and left common iliac arteries measure between 1.0 and 1.3 cm in diameter.
IMPRESSION: 1. There is mild dilation of the caliber of the abdominal aorta to 2.7 cm in
its proximal and mid portions but it tapers distally.
2. There are gallstones present without evidence of acute cholecystitis and
there is minimal gallbladder wall thickening.
3. Limited evaluation of the liver and pancreas reveals no acute
abnormality.

[REDACTED]

## 2015-02-25 NOTE — Consult Note (Signed)
PATIENT NAME:  [name], [name] MR#:  [id number] DATE OF BIRTH:  Feb 21, 1919  DATE OF CONSULTATION:  07/08/2012  REFERRING PHYSICIAN:  Francesco SorJames Hooten, MD CONSULTING PHYSICIAN:  Felipa Furnaceoberto Sanchez Gutierrez, MD  REASON FOR CONSULTATION: Orthostatic hypotension.  REASON FOR ADMISSION: Severe osteoarthritis status post total knee replacement.   HISTORY OF PRESENT ILLNESS: Mr. [name] is a very nice 81 year old gentleman who has history of coronary artery disease, hypertension, hyperlipidemia, severe osteoarthritis, and neuropathy. The patient was admitted on 07/05/2012 for total knee replacement, of his right lower extremity by Dr. Ernest PineHooten. Anesthesia was done with femoral nerve block and spinal and his EBL was only 15 mL. The patient has been doing very well and has not had much trouble. His hemoglobin has been stable but overall he was doing perfectly. He was due to be discharged today, but unfortunately he had an episode where his blood pressure dropped in the 80s/40s after getting up and moving around with physical therapy. For that reason, I was consulted by Dr. Ernest PineHooten to help out with evaluation to see if the patient could be discharged. The patient actually is doing very well right now, he is totally asymptomatic. He states that he has been drinking fluids and since he has been off the pain medication/OxyContin he is feeling much better. During the past couple of days he has been having some issues where he is slightly confused and he is having trouble reading but thinks that coming to the television he can see the letters but he just was not able to put them together. Today the issue has resolved and his pain medication has been held.  REVIEW OF SYSTEMS: CONSTITUTIONAL: The patient denies any fever, fatigue, or significant weakness. His pain is actually very well controlled. He states that he was having some issues with focusing on the television but right now its seems to be okay. He denies any blurry  vision, eye pain, or glaucoma. ENT: Denies any tinnitus, ear ache, or nasal discharge. RESPIRATORY: Denies any cough, wheezing, chronic obstructive pulmonary disease, or exertional dyspnea. CARDIOVASCULAR: Denies any chest pain, orthopnea, persistent nocturnal dyspnea, or syncope. GASTROINTESTINAL: Denies any nausea, vomiting, abdominal pain, or rectal bleeding. GENITOURINARY: Denies any dysuria or hematuria. GENITOURINARY/MALE: Positive for ED. ENDOCRINE: Denies any polyuria, polydipsia, polyphagia, or heat or cold intolerance. HEMATOLOGIC/LYMPHATIC: The patient denies any anemia or swollen glands in the past. Right now he has acute blood loss anemia. We are waiting for another CBC today. SKIN: Denies any acne, skin rashes, or lesions. MUSCULOSKELETAL: He is complaining of left knee pain that is actually better controlled. He is status post surgery. He denies redness on any joints or swelling of joints, other than the operative one, and he denies any gout. NEURO: The patient has peripheral neuropathy with some numbness and tingling of the lower extremities. He denies any CVAs, TIAs, or significant weakness. PSYCHIATRIC: Denies any depression, insomnia, or anxiety.   PAST MEDICAL HISTORY:  1. Peripheral neuropathy.  2. Coronary artery disease.  3. Hypertension.  4. Hyperlipidemia.  5. Asthma.  6. Coronary artery disease.  7. Hiatal hernia.  8. Elevated PSA.   PAST SURGICAL HISTORY:  1. Status post total knee replacement on 07/05/2012, of the left knee.  2. Status post pump placement for erectile dysfunction. He denies any other surgical issues.  ALLERGIES: No known drug allergies.  HOME MEDICATIONS: 1. Tramadol 50 mg three times daily. 2. Ranitidine 150 mg once daily. 3. Oxycodone 5 mg 1 to 2 every four hours. 4.  Milk of Magnesia.  5. Now on Lovenox. 6. Losartan 100 mg once daily.  7. Bisoprolol 5 mg once daily. 8. Amlodipine 5 mg once daily. 9. Aspirin 81 mg once daily. 10. Aleve 220 mg.    FAMILY HISTORY: Positive for coronary artery disease and myocardial infarction in his brother when he was in his 48s. Positive for prostate cancer in his brother as well. Denies any diabetes or seizures in the family.   SOCIAL HISTORY: The patient is married and he lives with his wife of 72 years of marriage. He is retired. He used to be an Art gallery manager. He denies any smoking or alcohol use.   PHYSICAL EXAMINATION:   VITAL SIGNS: Blood pressure now standing is 114/56, pulse 73, sitting blood pressure is 141/67, pulse 70, respiratory rate 18, and temperature 98.1.  GENERAL: Alert and oriented x3. No acute distress. No respiratory distress. Hemodynamically stable.   HEENT: Pupils are equal and reactive. Extraocular movements are intact. Mucosa is moist.   NECK: Supple. No JVD. No thyromegaly. No adenopathy. No oropharyngeal exudates. No oral lesions. Anicteric sclerae. Pink conjunctivae.  CARDIOVASCULAR: Regular rate and rhythm. Systolic ejection murmur, 1/6. No thrills. Apical heart tones palpated on midclavicular line.   LUNGS: Clear without any wheezing or crepitus. No dullness to percussion.   ABDOMEN: Soft, nontender, and nondistended. No hepatosplenomegaly. No masses. Bowel sounds are positive. No hepatic stigmata is observed.   GENITAL: Negative for external lesions.   EXTREMITIES: No edema. No cyanosis. No clubbing. Right lower extremity covered with dressing at the level of the knee. I do not uncover the wound as it has been checked by the surgeon.  NEUROVASCULAR: Pulses +2, capillary refill less than 3, and sensation is normal distally.   MUSCULOSKELETAL: Negative for abnormalities in joints, swelling of joints. The right lower extremity, at the level of the knee, is covered with dressing as mentioned above. Mildly tender to palpation. There is no Homans.   SKIN: No rashes or petechiae.  LYMPH: Negative for lymphadenopathy in neck, supraclavicular, epitrochlear, or axillary.    PSYCH: Mood is normal without any signs of depression.   NEUROLOGIC: Cranial nerves II through XII are intact. Nonfocal exam.   LABS: Creatinine was 0.95 yesterday. His hemoglobin is 9.1 today coming down from 10.3 and 11.1. Recommended to take iron over-the-counter 325 mg and continue Lovenox.   ASSESSMENT AND PLAN:  1. Status post total knee replacement. The patient is doing well. His pain is well tolerated.  2. Orthostatic hypotension. This is likely due to several issues to include acute blood loss anemia/decreased intravascular volume due to also decreased p.o. intake and decreased activity.  3. Spinal anesthesia. The patient is already off the time for spinal anesthesia has been working. He has actually already wore off.  4. Blood pressure medications. The patient actually has been taking blood pressure medications including amlodipine, bisoprolol, and losartan. I asked him to hold his blood pressure medications today and the patient will be able to restart losartan tomorrow and wait until Monday to start amlodipine and bisoprolol. If his blood pressure is still staying low below 120, I would recommend just to stay on losartan without the amlodipine but adding up the bisoprolol due to the fact that he has coronary artery disease, so he will be taking losartan and bisoprolol by Monday unless his blood pressure is high he can take all of them. He is recommended to rise slowly whenever he is sitting or lying down and not to do it  unsupervised and also whenever he gets up and walks to do it supervised as the patient is at high risk of falling. He is to follow up with his primary care physician for adjustment of his blood pressure medications with Dr. Bethann Punches. Other than that, I think the patient is safe to go home today with all the recommendations of the case. His coronary artery disease is stable. His other medical problems are stable.   TIME SPENT: I spent 60 minutes of my time with this  patient with education and explaining the importance of his blood pressure medication followup and good hydration as well as getting results from his orthostatics and labs. I recommend to take 325 mg of iron sulfate over-the-counter to replace his acute blood loss anemia. I think the patient is good to be discharged. We are going to let the nurses know so he can go home. ____________________________ Felipa Furnace, MD rsg:slb D: 07/08/2012 14:45:24 ET T: 07/08/2012 15:55:53 ET JOB#: [id number]  cc: Felipa Furnace, MD, <Dictator> Navjot Loera Juanda Chance MD ELECTRONICALLY SIGNED 07/14/2012 14:25

## 2015-02-25 NOTE — Discharge Summary (Signed)
PATIENT NAMELONDYN, [name] MR#:  [id number] DATE OF BIRTH:  [birth year]  DATE OF ADMISSION:  07/05/2012 DATE OF DISCHARGE:  07/11/2012  ADMITTING DIAGNOSIS: Degenerative arthrosis right knee.   DISCHARGE DIAGNOSIS: Degenerative arthrosis right knee.   PROCEDURE: Performed right total knee arthroplasty using computer-assisted navigation.  SURGEON: Barbera Setters. Hooten, Jr. M.D.    ASSISTANT: Hamilton Capri, PA-C.   ANESTHESIA: Femoral nerve block and spinal.   ESTIMATED BLOOD LOSS: 50 mL.   FLUIDS REPLACED: 1500 mL of crystalloid.   TOURNIQUET TIME: 84 minutes.   DRAINS: Two medium drains to reinfusion system.   HISTORY: Patient is a 81 year old gentleman returns today for re-evaluation of his right knee. He recently completed a series of Synvisc injections and does not appreciate improvement of his symptoms. However, he states that right knee pain is still sufficient and interferes with activities of daily living. He is not using any ambulatory aid. He localizes most of the pain along the medial aspect of the knee. Of note, patient is complaining of significant right shoulder pain. He underwent right subacromial decompression in November 2008 and did well until he fell and struck the shoulder approximately one year later. He has had intermittent discomfort to the shoulder, but now states that the right shoulder pain is increased in severity. He has some increased pain at night. He has increased pain with elevation of the arm. He denies any significant neck pain or radiation down the arm.   PHYSICAL EXAMINATION: GENERAL: Well developed, well-nourished male, no acute distress. Antalgic gait. Varus stress of the right knee. EXTREMITIES: Good strength, stability and range of motion the upper extremities. Good range of motion in the hips and ankles. RIGHT KNEE: Soft tissue swelling positive, effusion negative, erythema negative, crepitance positive. Medial joint line tenderness. Alignment relative  varus, mediolateral laxity, medial pseudolaxity. Anterior drawer test was negative. Lachman test negative. Pivot shift test not performed. McMurry test negative. Atrophy: No atrophy. Good quadriceps tone. Range of motion, extension and flexion greater than 110 degrees. VASCULAR: Peripheral pulses are palpable. Good cap refill. No gross pretibial ankle edema. Homans test is negative. NEUROLOGIC: Awake, alert and oriented. Sensory function is intact to pin prick and light touch. Motor strength is judged to be 5/5. Motor coordination is grossly within normal limits. No apparent clonus. No tremor.   HOSPITAL COURSE: Patient was admitted to the hospital on 07/05/2012. He had surgery that same day and was brought to the orthopedic floor from the PAC-U unit in stable condition. Patient progressed extremely well with physical therapy over the first two days. Patient wanted to go home but wife and family was concerned that there was no one at home to help take care of the patient due to wife not beeing in good health. Patient stayed over the weekend and continued to progress with physical therapy. Patient did have issues with blood pressure dropping very low and medicine was called in and managed his blood pressure by holding two of his medications such as amlodipine and bisoprolol. After blood pressure was greater than 120 and greater than 60 patient was restarted on his medications and did well. Patient did have some acute urinary retention, was thought to be due to the pain medicines. Bladder was drained and patient was able to urinate on his own. On August 07/11/2012 patient was stable and ready for discharge to rehab facility.   DISCHARGE INSTRUCTIONS:  1. Patient has a follow-up appointment 07/12/2011 at 9:45 a.m. with Cranston Neighbor. He also has  a follow-up appointment on 07/11/2012 at 2:15 with Fayrene FearingJames P. Hooten, M.D.  2. Patient will be evaluated and treated for difficulty walking and weight bearing as tolerated.   3. He will resume a regular diet and start taking Ensure Plus.  4. He will have neuro checks on operative leg every four hours and he will wear TED hose thigh high bilaterally and use incentive spirometry every one hour. He will cough and deep breathe every two hours.  5. Polar Care to maintain temperature between 40 and 50 degrees.  6. Activity is weight-bearing as tolerated, out of bed 2 times a day.  7. Patient should follow up with Dr. Hyacinth MeekerMiller, internal medicine, in 3 to 7 days to assess and make any changes as needed to patient's blood pressure. If blood pressure is less than 120/less than 60 patient is only to restart bisoprolol and losartan and hold amlodipine.    DISCHARGE MEDICATIONS:  1. Tylenol 500 to 1000 mg oral every four hours p.r.n. pain.  2. Celebrex 200 mg oral b.i.d.  3.   Amlodipine 5 mg oral daily 4. Tramadol 50 mg to 100 mg oral q.4 hours p.r.n. pain.  5. Bisoprolol 5 mg oral daily. Indication for hypertension.  6. Ranitidine 150 mg oral at bedtime. 7. Dulcolax 10 mg rectal daily p.r.n. constipation. 8. Milk of magnesia 30 mL oral b.i.d. p.r.n. constipation.  9. Aluminum mag hydroxide with simethicone 400/400/40, 40 mg/5 mL suspension, 30 mL oral q.6 hours p.r.n. indigestion, heartburn. 10. Senokot S 1 tablet oral b.i.d.  11. Lovenox 30 mg subcutaneous every 12 hours x11 days. 12. Polyethylene glycol powder 17 grams oral daily p.r.n. constipation. 13. Lactulose 10 grams/15 mL syrup 30 mL oral b.i.d. p.r.n. constipation. 14. Losartan 50 mg oral daily.  15. Clonidine 0.1 mg oral every six hours p.r.n. with systolic blood pressure greater than 180.  ____________________________ Evon Slackhomas C. Mairlyn Tegtmeyer, PA-C tcg:cms D: 07/11/2012 13:06:55 ET T: 07/11/2012 13:53:38 ET JOB#: [id number]  cc: Evon Slackhomas C. Fotios Amos, PA-C, <Dictator> Evon SlackHOMAS C Rakan Soffer PA ELECTRONICALLY SIGNED 07/17/2012 14:15

## 2015-02-25 NOTE — Op Note (Signed)
PATIENT NAMECAMILO, [name] MR#:  [id number] DATE OF BIRTH:  1919/07/20  DATE OF PROCEDURE:  07/05/2012  PREOPERATIVE DIAGNOSIS: Degenerative arthrosis of the right knee.   POSTOPERATIVE DIAGNOSIS: Degenerative arthrosis of the right knee.   PROCEDURE PERFORMED: Right total knee arthroplasty using computer-assisted navigation.   SURGEON: Illene Labrador. Angie Fava., MD    ASSISTANT: Van Clines, PA-C (required to maintain retraction throughout the procedure)   ANESTHESIA: Femoral nerve block and spinal.   ESTIMATED BLOOD LOSS: 50 mL.   FLUIDS REPLACED: 1500 mL of Crystalloid.   TOURNIQUET TIME: 84 minutes.   DRAINS: Two medium drains to reinfusion system.   SOFT TISSUE RELEASES: Anterior cruciate ligament, posterior cruciate ligament, deep and superficial medial collateral ligament, and patellofemoral ligament.   IMPLANTS UTILIZED: DePuy PFC Sigma size 5 posterior stabilized femoral component (cemented), size 5 MBT tibial component (cemented), 38 mm three peg oval dome patella (cemented), and a 10 mm stabilized rotating platform polyethylene insert.   INDICATIONS FOR SURGERY: The patient is a 81 year old gentleman who has been seen for complaints of progressive right knee pain. X-rays demonstrated significant degenerative changes in a tricompartmental fashion with relative varus deformity. After discussion of the risks and benefits of surgical intervention, the patient expressed his understanding of the risks and benefits and agreed with plans for surgical intervention.   PROCEDURE IN DETAIL: The patient was brought in the operating room and, after adequate femoral nerve block and spinal anesthesia was achieved, a tourniquet was placed on the patient's upper right thigh. The patient's right knee and leg were cleaned and prepped with alcohol and DuraPrep and draped in the usual sterile fashion. A "time-out" was performed as per usual protocol. The right lower extremity was exsanguinated using an  Esmarch and the tourniquet was inflated to 300 mmHg. Anterior longitudinal incision was made followed by a standard mid vastus approach. A moderate effusion was evacuated. The deep fibers of the medial collateral ligament were elevated in a subperiosteal fashion off the medial flare of the tibia so as to maintain a continuous soft tissue sleeve. Patella was subluxed laterally and the patellofemoral ligament was incised. Inspection of the knee demonstrated severe degenerative changes with eburnated bone noted to the medial compartment. Prominent osteophytes were debrided using a rongeur. Anterior and posterior cruciate ligaments were excised. Two 4.0 mm Schanz pins were inserted into the femur and into the tibia for attachment of the array of trackers used for computer-assisted navigation. Hip center was identified using circumduction technique. Distal landmarks were mapped using the computer. Distal femur and proximal tibia were mapped using the computer. Distal femoral cutting guide was positioned using computer-assisted navigation so as to achieve a 5 degree distal valgus cut. Cut was performed and verified using the computer. Distal femur was sized and it was felt that a size 5 femoral component was appropriate. Size 5 cutting guide was positioned and anterior cut was performed and verified using the computer. This was followed by completion of the posterior and chamfer cuts. The femoral cutting guide for central box was then positioned and central box cut was performed.   Attention was then directed to the proximal tibia. Medial and lateral menisci were excised. The extramedullary tibial cutting guide was positioned using computer-assisted navigation so as to achieve 0 degree varus valgus alignment and 0 degree posterior slope. Cut was performed and verified using the computer. Proximal tibia was sized and it was felt that a size 5 tibial tray was appropriate. Tibial and femoral trials  were inserted followed  by insertion of a 10 mm polyethylene insert. Knee was felt to be tight medially. A Cobb elevator was used to elevate the superficial fibers of the medial collateral ligament. This allowed for excellent mediolateral soft tissue balancing both in extension and in flexion. Finally, patella was cut and prepared so as to accommodate a 38 mm three peg oval dome patella. Patellar trial was placed and the knee was placed through a range of motion with excellent patellar tracking appreciated.   Femoral trial was removed. Central post hole for the tibial component was reamed followed by insertion of a keel punch. Tibial trial was then removed. Cut surfaces of bone were irrigated with copious amounts of normal saline with antibiotic solution using pulsatile lavage and then suctioned dry. Polymethyl methacrylate cement with gentamicin was prepared in the usual fashion using a vacuum mixer. Cement was applied to the cut surface of the tibia as well as along the undersurface of a size 5 MBT tibial component. Tibial component was positioned and impacted into place. Excess cement was removed using freer elevators. Cement was then applied to the cut surface of the femur as well as along the posterior flanges of size 5 posterior stabilized femoral component. Femoral component was positioned and impacted into place. Excess cement was removed using freer elevators. A 10 mm polyethylene trial was inserted and the knee was brought in full extension with steady axial compression applied. Finally, cement was applied to the backside of a 38 mm three peg oval dome patella and patellar component was positioned and patellar clamp applied. Excess cement was removed using freer elevators.   After adequate curing of cement, tourniquet was deflated after a total tourniquet time of 84 minutes. Hemostasis was achieved using electrocautery. The knee was irrigated with copious amounts of normal saline with antibiotic solution using pulsatile  lavage and then suctioned dry. The knee was inspected for any residual cement debris. 30 mL of 0.25% Marcaine with epinephrine was injected along the posterior capsule. A 10 mm stabilized rotating platform polyethylene insert was inserted and the knee was placed through a range of motion. Excellent patellar tracking was appreciated. Excellent mediolateral soft tissue balancing was appreciated.   Two medium drains were placed in the wound bed and brought out through a separate stab incision to be attached to a reinfusion system. The medial parapatellar portion of the incision was reapproximated using interrupted sutures of #1 Vicryl. Subcutaneous tissue was approximated in layers using first #0 Vicryl followed by #2-0 Vicryl. Skin was closed with skin staples. A sterile dressing was applied.   The patient tolerated the procedure well. He was transported to the recovery room in stable condition.    ____________________________ Illene LabradorJames P. Angie FavaHooten Jr., MD jph:drc D: 07/05/2012 23:26:13 ET T: 07/06/2012 09:50:45 ET JOB#: [id number]  cc: Illene LabradorJames P. Angie FavaHooten Jr., MD, <Dictator> JAMES P Angie FavaHOOTEN JR MD ELECTRONICALLY SIGNED 07/10/2012 8:01

## 2015-02-28 NOTE — Consult Note (Signed)
PATIENT NAME:  [name], [name] MR#:  [id number] DATE OF BIRTH:  07/24/1919  DATE OF CONSULTATION:  11/17/2012  REFERRING PHYSICIAN:  Bethann Punches, MD CONSULTING PHYSICIAN:  Christena Deem, MD  REASON FOR CONSULTATION: Abdominal pain.   HISTORY OF PRESENT ILLNESS: Mr. Kersh is a 81 year old Caucasian male who was admitted last night for complaints of abdominal pain. He had actually called his primary physician's office earlier in the day with complaint of increased lower abdominal pain. He did get to the hospital apparently later in the afternoon. He states that about 3 to 4 months ago he had some chest discomfort that seemed to move to his lower abdomen and has remained there since. He has not had multiple episodes of chest discomfort but rather has continued to have lower abdominal pain. He has had some increasing constipation as well. The pain is near daily, may last for several hours at the time, and at times is more severe than others. He was seen in GI and was prescribed some MiraLAX which he has been taking irregularly. He states that about 2 days ago the pain seemed to get much worse in a very quick fashion necessitating him to call his primary physician. He has also been seen in consultation as an outpatient by Dr. Katrinka Blazing. Evaluation via CT previously as an outpatient showed some cholelithiasis without evidence of cholecystitis. He denies any problems of nausea or vomiting. There is no heartburn or dysphagia. There is no black stools, blood in her stools or slimy stools. He is unsure as to whether he has had a previous colonoscopy. Review of charts indicate a colonoscopy having been done on 06/06/2008 by Dr. Beckey Downing.  This showed some internal hemorrhoids and an enlarged prostate, otherwise normal examination. There is no comment as to whether he could have diverticulosis or not. He had a EGD by this physician due to hematemesis, 11/15/2007, that showing a hiatal hernia, a nonbleeding erosive  gastropathy, as well as multiple nodules in the stomach. There is question as to whether he may have esophageal varices. He had a previous colonoscopy in July of 2006 that showing a single polyp having been removed.   GI FAMILY HISTORY: Negative for colorectal cancer, liver disease or ulcers.   PAST MEDICAL HISTORY:  1. Gout. 2. Hypertensive cardiovascular disease.  3. Chest pain.  4. Hyperlipidemia.  5. Hypertension.  6. Degenerative arthritis of the shoulder.  7. Abnormal PSA being followed by his urologist.  8. Nonsignificant diffuse coronary atherosclerosis. 9. Peripheral neuropathy.  10. History of upper GI bleed in the past.  OUTPATIENT MEDICATIONS: 1. Aspirin 81 mg. 2. Bisoprolol 5 mg once a day. 3. Citrucel. 4. Flomax 0.4 mg once a day. 5. Milk of magnesia p.r.n. 6. MiraLAX p.r.n. 7. Omeprazole 20 mg a day. 8. Senokot Extra p.r.n. 9. Tramadol 50 mg 3 times a day p.r.n.  10. Trazodone 50 mg once a day. 11. Vitamin B12 tablets 1000 mcg daily.   ALLERGIES: ACE INHIBITORS.  PHYSICAL EXAMINATION:  VITAL SIGNS: Temperature is 97.7, pulse 105, respirations 18, blood pressure 188/69 and pulse oximetry 94%.  GENERAL: He is a 81 year old Caucasian male in no acute distress. (The patient was seen this morning at 0630.   HEENT: Normocephalic, atraumatic. Eyes are anicteric. Nose septum midline, no lesions. Oropharynx no lesions.  NECK: Supple. No JVD. No lymphadenopathy. No thyromegaly.   CARDIOVASCULAR: Heart regular rate and rhythm.   PULMONARY: Lungs clear.  ABDOMEN: Soft. There is minimal tenderness this being in  the epigastric region. Bowel sounds positive, normal active. There is no apparent organomegaly or mass felt. He is indeed a little bit scaphoid.   EXTREMITIES: No clubbing, cyanosis or edema.   NEUROLOGICAL: Cranial nerves II through XII grossly intact. Muscle strength bilaterally equal and symmetric, 5 out of 5. DTRs bilaterally equal and  symmetric.  RECTAL: Anorectal examination deferred. The patient reports no rectal bleeding.   LABS/RADIOLOGIC STUDIES: On admission last night, he had a glucose of 89, BUN 19, creatinine 0.98, sodium 134, potassium 3.8, chloride 98 and bicarbonate 27. Lipase was 66. Hepatic profile normal. Hemogram showing white count of 10.5, hemoglobin and hematocrit 11.9/33.8, platelet count 368 and MCV 86.   He has had blood cultures x 2, negative for 12 hours.   He had an EKG showing normal sinus rhythm, left axis deviation, voltage criteria for LVH.  He had a CT scan of the abdomen and pelvis this showing multiple gallstones without objective evidence of acute cholecystitis. Nonobstructing urinary tract stone, stable. No evidence of acute appendicitis or colitis or small bowel or large bowel obstruction. No intraabdominal or pelvic lymphadenopathy or ascites. 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 No basilar pneumonia. There is a subpleural nodule stable at 3 mm, from previous study, in the right middle lobe.   ASSESSMENT: Abdominal pain. The patient relates this as being lower. There has been a change in bowel habits to increase constipation. I am uncertain as to whether his bowel regimen currently is adequate as he seems to be taking several different things at different times. He has had a marked weight loss over the past 3 to 5 months this being about 50 pounds or so.  RECOMMENDATIONS: In light of CT and lab results would recommend luminal evaluation via EGD and colonoscopy. I discussed the risks, benefits and complications of these procedures      to include but not limited to bleeding, infection, perforation and risk of sedation. He wishes to proceed. We will prep him this morning and proceed later this afternoon. ____________________________ Christena DeemMartin U. Skulskie, MD mus:sb D: 11/17/2012 13:11:01 ET T: 11/17/2012 14:00:53 ET JOB#: [id number]  cc: Christena DeemMartin  U. Skulskie, MD, <Dictator> Danella PentonMark F. Miller, MD Christena DeemMARTIN U SKULSKIE MD ELECTRONICALLY SIGNED 12/05/2012 15:44

## 2015-02-28 NOTE — Consult Note (Signed)
Chief Complaint:   Subjective/Chief Complaint Covering for Dr. Marva PandaSKulskie. No abd pain. Tolerating liquids.   VITAL SIGNS/ANCILLARY NOTES: **Vital Signs.:   11-Jan-14 05:22   Vital Signs Type Routine   Temperature Temperature (F) 98.2   Celsius 36.7   Temperature Source Oral   Pulse Pulse 73   Respirations Respirations 18   Systolic BP Systolic BP 141   Diastolic BP (mmHg) Diastolic BP (mmHg) 65   Mean BP 90   Pulse Ox % Pulse Ox % 96   Pulse Ox Activity Level  At rest   Oxygen Delivery Room Air/ 21 %   Brief Assessment:   Cardiac Regular    Respiratory clear BS    Gastrointestinal Normal   Lab Results: Routine Micro:  09-Jan-14 20:17    Micro Text Report BLOOD CULTURE   COMMENT                   NO GROWTH IN 36 HOURS   ANTIBIOTIC                        Culture Comment NO GROWTH IN 36 HOURS  Result(s) reported on 18 Nov 2012 at 07:43AM.   Assessment/Plan:  Assessment/Plan:   Assessment Abd pain. Resolved. Neg CT/EGD/colon.    Plan Stable for discharge soon. Will sign off. Thanks.   Electronic Signatures: Lutricia Feilh, Steffen Hase (MD)  (Signed 11-Jan-14 08:54)  Authored: Chief Complaint, VITAL SIGNS/ANCILLARY NOTES, Brief Assessment, Lab Results, Assessment/Plan   Last Updated: 11-Jan-14 08:54 by Lutricia Feilh, Trey Gulbranson (MD)

## 2015-02-28 NOTE — H&P (Signed)
PATIENT NAME:  Tomie ChinaETREE, Hakim H MR#:  [id number] DATE OF BIRTH:  [birth year]  DATE OF ADMISSION:  02/11/2013  REFERRING PHYSICIAN: Dr. Egbert GaribaldiBird in the Emergency Room  PRIMARY CARE PHYSICIAN:  Dr.  Bethann PunchesMark Miller   REASON FOR ADMISSION: Progressive weakness with dehydration and failure to thrive.   HISTORY OF PRESENT ILLNESS: The patient is a 81 year old male with a history of benign prostatic hypertrophy, elevated PSA, bladder outlet obstruction with chronic indwelling Foley catheter followed by Dr. Lonna CobbStoioff. The patient also has a history of underlying coronary artery disease and peripheral neuropathy. The patient presented to the Emergency Room with a 3 to 4 week history of progressive weakness. The wife states that he has not been out of bed in 3 weeks. Has not had his Foley changed in over a month. He has been more lethargic with some confusion. She noticed foul-smelling urine and he was brought to the Emergency Room by EMS where he was noted to have severe urinary tract infection with dehydration. She is unable to care for the patient at home and he is now admitted for further evaluation.   PAST MEDICAL HISTORY: 1.  Benign prostatic hypertrophy with bladder or obstruction.  2.  Elevated PSA.  3.  Peripheral neuropathy.  4.  Hiatal hernia.  5.  Hyperlipidemia.  6.  Essential hypertension.  7.  Atherosclerotic cardiovascular disease.  8.  Asthma.   MEDICATIONS:  1.  B12 1000 mcg p.o. daily.  2.  Trazodone 50 mg p.o. at bedtime.  3.  Ultram 50 mg p.o. t.i.d. p.r.n. pain.  4.  Omeprazole 20 mg p.o. daily.  5.  Flomax 0.4 mg p.o. daily.  6.  MiraLAX 17 grams p.o. b.i.d.  7.  Aspirin 81 mg p.o. daily.  8.  Bisoprolol 5 mg p.o. daily.   ALLERGIES: ACE INHIBITORS.   SOCIAL HISTORY: The patient is married. No apparent history of alcohol or tobacco abuse.   FAMILY HISTORY: Positive for stroke and coronary artery disease. Negative for prostate or colon cancer. Negative for diabetes.   REVIEW OF  SYSTEMS.: CONSTITUTIONAL:  No fever, although he has had weight loss.  EYES: No blurred or double vision. No glaucoma.  ENT: No tinnitus or hearing loss. No nasal discharge or bleeding. No difficulty swallowing.  RESPIRATORY: No cough or wheezing. Denies hemoptysis. No painful respiration.  CARDIOVASCULAR: No chest pain or orthopnea. No palpitations or syncope.  GASTROINTESTINAL: No nausea, vomiting, or diarrhea. No abdominal pain. No change in bowel habits.  GENITOURINARY: No dysuria or hematuria. No incontinence.  ENDOCRINE: No polyuria or polydipsia. No heat or cold intolerance.  HEMATOLOGIC: The patient denies anemia, easy bruising or bleeding.  LYMPHATIC: No swollen glands.  MUSCULOSKELETAL: The patient denies pain in his neck, back, shoulders, knees or hips. No gout.  NEUROLOGIC: No numbness or migraines. Denies stroke or seizures. Does complain of weakness.  PSYCHOLOGIC: The patient denies anxiety, insomnia, or depression.   PHYSICAL EXAMINATION: GENERAL: The patient is chronically ill-appearing but in no acute distress.  VITAL SIGNS: Vital signs are currently remarkable for a blood pressure of 177/77 with a heart rate of 78 and a respiratory rate 18. He is afebrile.  HEENT: Normocephalic, atraumatic. Pupils are equally round and reactive to light and accommodation, extraocular movements are intact. Sclerae anicteric. Conjunctivae are clear. Oropharynx is dry but clear.  NECK: Supple without JVD. No adenopathy or thyromegaly is noted.  LUNGS: Clear to auscultation and percussion without wheezes, rales or rhonchi. No dullness. Respiratory effort is normal.  CARDIAC: Regular rate and rhythm. Normal S1 and S2. No significant rubs, murmurs or gallops. PMI is nondisplaced. Chest wall is nontender.  ABDOMEN: Soft, nontender with normoactive bowel sounds. No organomegaly or masses were appreciated. No hernias or bruits were noted.  EXTREMITIES: Without clubbing, cyanosis or edema. Pulses were  2+ bilaterally.  SKIN: Warm and dry without lesions. There is an erythematous rash noted in a bandlike fashion across his left thigh.  NEUROLOGIC: Cranial nerves II through XII grossly intact. Deep tendon reflexes were symmetric. Motor and sensory exam is nonfocal.  PSYCHIATRIC: The patient who was alert and oriented to person and place, but not to time.   LABORATORY DATA: Urinalysis revealed 2+ leukocyte esterase with 19,958 WBCs per high-power field with 3+ bacteria. White count was 21.4 with a hemoglobin of 12.4. Glucose 113 with a BUN of 25, creatinine 0.75 and a GFR greater than 60.   EKG revealed sinus rhythm with no acute ischemic changes.   ASSESSMENT: 1.  Urinary tract infection.  2.  Dehydration.  3.  Failure to thrive.  4.  Benign prostatic hypertrophy with bladder obstruction.  5.  Atherosclerotic cardiovascular disease.  6.  Progressive weakness.  7.  Benign hypertension.   PLAN: The patient will be admitted to the floor with IV fluids and IV antibiotics. We will send off blood and urine cultures at this time. We will consult urology for his urologic symptoms and urinary tract infection. Foley catheter has been changed. We will perform neuro checks every 4 hours. We will consult physical therapy and the case manager as the patient will require placement. Follow-up routine labs in the morning. Further treatment and evaluation will depend upon the patient's progress.   TOTAL TIME SPENT ON THIS PATIENT: 50 minutes.    ____________________________ Duane Lope Judithann Sheen, MD jds:cc D: 02/11/2013 17:04:38 ET T: 02/11/2013 17:23:23 ET JOB#: [id number]  cc: Duane Lope. Judithann Sheen, MD, <Dictator> Danella Penton, MD Markeshia Giebel Rodena Medin MD ELECTRONICALLY SIGNED 02/11/2013 19:19

## 2015-02-28 NOTE — Consult Note (Signed)
Brief Consult Note: Diagnosis: Right shoulder pain likely impingement.   Patient was seen by consultant.   Consult note dictated.   Comments: Patient has had longstanding right shoulder pain.  States he has had previous surgery by Dr. Ernest PineHooten in 2008 for impingement.  Patient states he has pain to palpation of the right shoulder but not when he is lying in bed or when he moves it.  Denies any recent injury.  States he wants to get the shoulder "fixed".  On exam he has no erythema, ecchymosis or swelling.  He can actively forward elevate and abduct and internally and externally rotate the right shoulder with good motion and no pain.  He demonstrates no rotator cuff weakness.  He is neurovascularly intact.  His radiographs demonstrate no fracture or dislocation.  Patient likely has chronic impingement.  He would benefit from an oral anti-inflammatory, at his age Celebrex may be the best choice, if medically ok to take NSAIDs.  Otherwise he may be evaluated and treated by PT and may return for orthopaedic followup as an outpatient with either Dr. Ernest PineHooten or myself.  Electronic Signatures: Juanell FairlyKrasinski, Zailen Albarran (MD)  (Signed 08-Apr-14 12:06)  Authored: Brief Consult Note   Last Updated: 08-Apr-14 12:06 by Juanell FairlyKrasinski, Himani Corona (MD)

## 2015-02-28 NOTE — Discharge Summary (Signed)
PATIENT NAMTomie [name]:  [name] MR#:  [id number] DATE OF BIRTH:  Sep 19, 1919  DATE OF ADMISSION:  02/11/2013 DATE OF DISCHARGE:  02/16/2013  DISCHARGE DIAGNOSES: 1.  Staphylococcus aureus urinary tract infection with encephalopathy.  2.  Chronic bladder outlet obstruction.  3.  Hypertension.  4.  Moderate dementia, Alzheimer's type.  5.  Right shoulder impingement.  6.  Chronic constipation.   DISCHARGE MEDICATIONS: 1.  Aspirin 81 mg daily.  2.  Bisoprolol 5 mg daily.  3.  Omeprazole 20 mg daily.  4.  Tramadol 50 mg t.i.d. p.r.n. pain. 5.  Vitamin C 500 mg daily.  6.  Acetaminophen 325 mg 2 tabs q. 4 p.r.n. pain or fever.  7.  Flomax 0.4 mg daily.  8.  Trazodone 25 mg at bedtime.  9.  Amlodipine 10 mg at bedtime.  10.  MiraLax 17 grams b.i.d.  11.  Potassium chloride 10 milliequivalents daily.  12.  Levofloxacin 250 mg daily x 10 days.   REASON FOR ADMISSION: A 81 year old male who presents with altered mental status and UTI.  Please see [name] and P for HPI, past medical history and physical exam.   HOSPITAL COURSE: The patient was admitted, started on Levaquin and ultimately vancomycin. Urine grew pansensitive staph aureus. His white count improved from the 15,000 range down to 10,000. His hemoglobin was 11. Potassium was in the lower 3.3 to 3.4 range with supplementation. He initially was fairly sedated by the nightly trazodone dose.  Dropped it to 25 mg and that seemed to improve, in addition to his hydration and treatment of urinary tract infection. He was back to baseline status. He was requiring significant in and out caths and the Foley catheter was replaced chronically to avoid profound renal dysfunction from his bladder outlet obstruction. Overall prognosis is poor with his multiple medical problems and weakened state. He will be getting OT and PT at skilled nursing.  ____________________________ Danella PentonMark F. Miller, MD mfm:sb D: 02/16/2013 07:34:16 ET T: 02/16/2013 08:18:27  ET JOB#: [id number]  cc: Danella PentonMark F. Miller, MD, <Dictator> MARK Sherlene ShamsF MILLER MD ELECTRONICALLY SIGNED 02/20/2013 8:05

## 2015-02-28 NOTE — Consult Note (Signed)
Chief Complaint:   Subjective/Chief Complaint Please see EGD and colonoscopy reports.  EGD showing small sliding hiatal hernia.  Colonoscopy showing a redundant colon with relatively poor tone.  Unable to go beyond the hepatic flexure despite multiple efforts and position shcnges and support.  No obvious other lesion noted.  Recommend bid dosing of miralax.  If there is another acute episode of abdominal pain, I would be concerned of volvulus if the clinical picture was appropriate and plain films may be informative.  Bowel relaxants would likely increase constipation.   Would need to do ACBE or CT colonography if there is concern about evaluating the ascending colon further.  Of note there is no evidence of mass lesion noted in that area on 2 serial ct scans. Case discussed with Dr Hyacinth MeekerMiller.   Electronic Signatures: Barnetta ChapelSkulskie, Cinch Ormond (MD)  (Signed 10-Jan-14 17:15)  Authored: Chief Complaint   Last Updated: 10-Jan-14 17:15 by Barnetta ChapelSkulskie, Abbegale Stehle (MD)

## 2015-02-28 NOTE — H&P (Signed)
PATIENT NAME:  [name], [name] MR#:  [id number] DATE OF BIRTH:  [birth year]  DATE OF ADMISSION:  11/16/2012  DICTATING FOR: Leonie Douglas. Doy Hutching, MD  CHIEF COMPLAINT: Abdominal pain.  HISTORY OF PRESENT ILLNESS: This is a 81 year old who presented to the clinic today with abdominal pain that was severe this morning. He has taken tramadol on 2 occasions today to control the pain and has felt week with some mild nausea as well as some chills. He has a history of recurrent abdominal pain, more specifically over the last few months, requiring urgent care and ED visits. He has been seen by general surgery and gastroenterology at [facility] for these ongoing issues. He is scheduled for a colonoscopy on Monday. His pain was worse today and so he presented to the clinic. He had a CT scan of the abdomen and pelvis 10/21/2012 showing gallstones without cholecystitis, nonobstructing renal stones, atherosclerosis, renal and hepatic cysts and a questionable nodule in the right middle lobe of the lung. He had no acute GI findings at that time.   PAST MEDICAL HISTORY:  1.  History of gout.  2.  Hypertensive cardiovascular disease.  3.  Chest pain.  4.  Hyperlipidemia.  5.  Hypertension.  6.  Degenerative arthritis.  7.  Abnormal PSA.  8.  Peripheral neuropathy.  9.  History of upper GI bleed with ulcerations.   MEDICATIONS:  1.  Aspirin 81 mg daily. 2.  Bisoprolol 5 mg daily.  3.  Tramadol 50 mg 1 tablet 3 times a day.  4.  Omeprazole 20 mg daily.  5.  Vitron-C 1 tablet daily.  6.  Trazodone 50 mg at bedtime.  7.  MiraLax 1/2 scoop twice a day as needed.   ALLERGIES: ACE INHIBITORS.   SOCIAL HISTORY: Retired. Lives with his wife. No alcohol or tobacco.   FAMILY HISTORY: Negative for colorectal cancer, liver disease, colon polyps or peptic ulcer disease.  REVIEW OF SYSTEMS: As per HPI. He denies any congestion or cough. No difficulty urinating. No peripheral edema. Does have numbness in his feet.    PHYSICAL EXAMINATION:  GENERAL: Elderly male, mild distress.  VITAL SIGNS: Weight is 169, blood pressure 188/74, temperature 99.2, pulse 88, O2 sat 98% on room air.  HEENT: Head is normocephalic, atraumatic. EOMs are intact. His pupils are equal, round and reactive to light. Ears. Left TM is occluded by cerumen. Right TM is clear with normal landmarks. His oropharynx is clear with uvula midline. No lesions.  NECK: Supple with no thyromegaly, no lymphadenopathy.  HEART: Regular rate and rhythm. No murmurs noted.  LUNGS: Clear to auscultation, diminished. ABDOMEN: Hypoactive bowel sounds with epigastric and periumbilical tenderness without rebound. Mild guarding. Negative Murphy's sign. No pain in the right lower quadrant or left lower quadrant.  SKIN: Warm and dry.  NEUROLOGIC: Nonfocal. His pulses are 2+ symmetric to the lower extremities and upper extremities.   ASSESSMENT AND PLAN:  1.  Abdominal pain, unclear etiology. He has had evaluations in the last month by gastroenterology and general surgery. He has been treated for constipation with MiraLax daily, but continues to have recurrent abdominal pain. He has been taking tramadol. Today he presents with fever and more severe discomfort in the epigastric and periumbilical region. His temperature is 99.2. He will be started on Zosyn 3.375 IV every 8 hours, given pain medicine for pain control. Begin IV fluids at normal saline at 100 per hour and placed nil per os except for ice chips. Will obtain  labs on admission, CBC, MET-C, lipase and urinalysis. Repeat CT scan of the abdomen with contrast. Will also consult general surgery and gastroenterology.  2.  Hypertension. Increase bisoprolol to 5 mg b.i.d.  3.  Cardiovascular disease. Obtain EKG on admission.  4.  History of urinary retention. Currently stable. Will check a urinalysis.    ____________________________ Marily Lente. Jamesina Gaugh, PA-C rjt:jm D: 11/16/2012 16:48:58 ET T: 11/16/2012 17:42:31  ET JOB#: [id number]  cc: Marily Lente. Lolita Lenz, <Dictator> Leonard Downing PA ELECTRONICALLY SIGNED 11/20/2012 9:30

## 2015-02-28 NOTE — H&P (Signed)
PATIENT NAME:  [name], Krayton H MR#:  [id number] DATE OF BIRTH:  Mar 17, 1919  DATE OF ADMISSION:  11/16/2012  HISTORY OF PRESENT ILLNESS: This 81 year old was admitted emergently to the hospital with a chief complaint of lower abdominal pain.   He reports that he has been having some lower abdominal pains for several months, has recently been taking some tramadol which helps; but then this morning, he had a fairly severe lower abdominal pain, which is somewhat diffuse and was not lateralized either to the left or right.  There was minimal discomfort in his lower back. He has been very anorexic for many months, has lost about 50 pounds in the last 6 to 7 months report. He reports no nausea or vomiting. He does have decreased oral intake, has had some issues with constipation and has been taking some MiraLAX and occasional milk of magnesia.  That helps to keep his bowels moving but he says there is not a lot of stool coming out because he says there is not much in him.  He has had no rectal bleeding. He had recently been seen in the office on 10/23/2012, and I had recommended he have colonoscopy for further evaluation and was referred to gastroenterology department and actually had that scheduled for approximately four days from now.   It was also noted that he had no chills or fever, had some slight dizziness when he was hurting. He reports the pain has resolved, so he is not having any pain in his hospital room.   past medical history: Was reviewed, does include:  1.  History of hypertension.  2.  Hyperlipidemia.  3.  Records indicate a history of cardiovascular disease.  4.  History of degenerative arthritis.  5.  History of urinary retention and had a recent need for indwelling Foley catheter, which has subsequently been removed.  6.  History of peripheral neuropathy.  7.  He has a history of upper gastrointestinal bleeding due to ulcers.  8.  He cannot recall having colonoscopy in the past.  9.   Does have a history of gout.   PREVIOUS SURGERY: Includes bilateral laparoscopic inguinal hernia repair. He recalls no other abdominal surgery.   MEDICATIONS: Include aspirin 81 mg daily, bisoprolol 5 mg daily, Citrucel daily, Flomax 0.4 mg daily, milk of magnesia 15 mL about 3 times a week, MiraLAX a capful daily, omeprazole 20 mg daily, Senokot daily, tramadol 3 times per day, trazodone 50 mg h.s., vitamin B12 of 1000 mcg daily.   ALLERGIES TO ACE inhibitors.   SOCIAL HISTORY: Does not smoke, does not drink any alcohol.   FAMILY HISTORY: Positive for colon cancer, stomach cancer, diabetes, heart disease.   Review of systems: He reports no recent acute illness such as cough, cold, or sore throat. No visual changes.  He has just some mild difficulty hearing. He has had no recent epigastric pains. No chest pains. He reports no recent sores or boils. No swollen glands. He reports no ankle edema.  He does report he has recently been voiding satisfactorily in the last few weeks.   PHYSICAL EXAMINATION:  GENERAL: He is awake, alert, and oriented, resting in his hospital bed.  No acute distress. VITAL SIGNS: Pulse is 67.  Blood pressure 211/81.  Respiratory rate 22. Pulse oximetry 100%.  Temperature 97.4.  SKIN: Somewhat pale. He is very asthenic.  HEAD, EYES, EARS, NOSE, AND THROAT: Pupils equal, reactive to light. Extraocular movements are intact.  Palpebral conjunctivae normal red color. Pharynx  clear.  NECK: No palpable mass.  LUNGS: Sounds are clear. No respiratory distress.  HEART: Regular rhythm, S1 and S2 without murmur.  ABDOMEN: Is with mild hyperactivity of bowel sounds.  Abdomen is flat and soft. There is just very minimal degree of left lower quadrant tenderness, which makes him feel like he needs to empty his bladder. No palpable mass.  RECTAL: Rectal exam demonstrates good sphincter tone. There is no rectal tenderness. No palpable mass.  Does have marked symmetrical enlargement of the  prostate, which is smooth.  EXTREMITIES: No dependent edema.  NEUROLOGIC: Awake, alert, oriented, moving all extremities.   CLINICAL DATA: His blood work today, BUN is elevated at 19, creatinine 0.98, sodium 134. Liver panel normal. White blood count 10,500, hemoglobin 11.9, MCV of 86, platelet count 368,000.   I have reviewed the ultrasound of the abdomen, which was October 10, 2012, which demonstrated multiple gallstones, mild thickening of the gallbladder wall. Abdominal aortic measurement was 2.7 cm.   CT scan of the abdomen was done 10/21/2012, which demonstrated gallstones, nonobstructing right renal calculi, significant distention of the urinary bladder, multiple clips related to bilateral laparoscopic inguinal hernia repair, multiple renal and hepatic cysts, nonspecific minimal nodular density right middle lobe of the lung.   IMPRESSION:  1.  Lower abdominal pain, may possibly be urinary retention.  2.  Chronic anorexia and profound weight loss with failure to thrive.  3.  Symptoms of obstipation and constipation.   There is a plan for a CT scan of the abdomen.  I also think there may be a role for colonoscopy, it has been planned, and would just anticipate he might follow through with a colonoscopy while here in the hospital.  I have spoken briefly with Dr. Marva Panda, who will plan to see him while he is here in the hospital.   I discussed with him that no surgery is recommended tonight.  We know he has gallstones but does not clearly need to have gallbladder surgery at present. We will plan to follow.   ____________________________ Shela Commons. Renda Rolls, MD jws:th D: 11/16/2012 20:11:45 ET T: 11/16/2012 20:45:36 ET JOB#: [id number]  cc: Adella Hare, MD, <Dictator> Adella Hare MD ELECTRONICALLY SIGNED 11/20/2012 14:03

## 2015-02-28 NOTE — Consult Note (Signed)
Brief Consult Note: Diagnosis: abdominal pain of obscure etiology.   Patient was seen by consultant.   Recommend to proceed with surgery or procedure.   Orders entered.   Discussed with Attending MD.   Comments: Patietn seen and examined, full consult to follow.  Patietn presenting with lower abdominal pain occuring over the pass 2-3 months, severe episode yesterday.  Pain will go away near completely between usually daily occurance.  Increasing constipation with 50 # weight loss over the past 6 months or so.  Will plan for egd and colonoscopy later today.  I have discussed the risks benefits and complications of egd and colonoscopy to include not limited to bleeding infection perforation and sedation and he wishes to proceed.  Electronic Signatures for Addendum Section:  Barnetta ChapelSkulskie, Delante Karapetyan (MD) (Signed Addendum 10-Jan-14 13:09)  see consult [phone].   Electronic Signatures: Barnetta ChapelSkulskie, Darnelle Derrick (MD)  (Signed 10-Jan-14 08:00)  Authored: Brief Consult Note   Last Updated: 10-Jan-14 13:09 by Barnetta ChapelSkulskie, Dejean Tribby (MD)

## 2015-02-28 NOTE — Consult Note (Signed)
Mr. [name] is followed for BPH, elevated PSA and a nodular prostate.  He has had several negative prostate biopsies.  He has most recently been seen for intermittent urinary retention.  A Foley catheter was placed in early March and he is awaiting a cystoscopy appointment which apparently has not yet been scheduled.  He was admitted with altered mental status and urine culture is growing greater than 100,000 staph aureus. 1.  Urinary retention-intermittent  2.  UTI associated with an indwelling catheter His catheter has been indwelling for one month.  Would recommend catheter removal/voiding trial.  Start intermittent catheterization for persistent retention.  Will follow.  Electronic Signatures: Riki AltesStoioff, Scott C (MD)  (Signed on 08-Apr-14 08:37)  Authored  Last Updated: 08-Apr-14 08:37 by Riki AltesStoioff, Scott C (MD)

## 2015-02-28 NOTE — Consult Note (Signed)
He has voided small amounts since the catheter was removed.  Intermittent catheterization volumes have ranged from 200-475 cc.  He prefers intermittent catheterization to an indwelling Foley. Urinary retention1. Will schedule cystoscopy in office. 2.  If he is discharged to a SNF that does not perform intermittent catheterization the Foley may need to be replaced.  Electronic Signatures: Riki AltesStoioff, Scott C (MD)  (Signed on 09-Apr-14 15:50)  Authored  Last Updated: 09-Apr-14 15:50 by Riki AltesStoioff, Scott C (MD)

## 2015-02-28 NOTE — Discharge Summary (Signed)
PATIENT NAMTomie China:  [name], [name] MR#:  [id number] DATE OF BIRTH:  Aug 04, 1919  DATE OF ADMISSION:  11/16/2012 DATE OF DISCHARGE:  11/18/2012  DISCHARGE DIAGNOSES: 1. Abdominal pain secondary to colonic stricture.  2. Hypertensive cardiovascular disease.  3. History of upper gastrointestinal bleed with ulcerations.  4. Hypertension.  5. Hyperlipidemia.   DISCHARGE MEDICATIONS:  Aspirin 81 mg daily, Bisoprolol 5 mg daily, tramadol 50 mg t.i.d. p.r.n. pain, omeprazole 20 mg daily, trazodone 50 mg at bedtime, MiraLax 1 scoop twice daily.   REASON FOR ADMISSION: A 81 year old male who presents with abdominal pain. Please see H and P for history of present illness, past medical history, and physical exam.   HOSPITAL COURSE: The patient was admitted. Abdominal CT was unremarkable. He underwent a colonoscopy which showed normal mucosa with some narrowing.  It was found that his abdominal pain essentially resolved with a good bowel movement. Dr. Marva PandaSkulskie thought that strictures may be partly responsible for this. He has been on a very off and on bowel regimen, and Dr. Marva PandaSkulskie suggested MiraLax twice daily. The patient is good with that and will have a regular diet with fiber. Chest x-ray was normal as well. There is not a clear etiology for his 50-pound weight loss.  I encouraged him to continue his caloric intake. The patient will go home and will follow up with Dr. Hyacinth MeekerMiller next week.   ____________________________ Danella PentonMark F. Leeon Makar, MD mfm:cb D: 11/18/2012 09:14:28 ET T: 11/19/2012 07:48:49 ET JOB#: [id number]  cc: Danella PentonMark F. Kenzley Ke, MD, <Dictator> Yitzel Shasteen Sherlene ShamsF Kaleya Douse MD ELECTRONICALLY SIGNED 11/20/2012 8:30

## 2020-01-07 DEATH — deceased
